# Patient Record
Sex: Female | Born: 1975 | Race: White | Hispanic: No | Marital: Single | Smoking: Current every day smoker
Health system: Southern US, Community
[De-identification: ages and names within clinical notes are randomized; demographics above are authoritative.]

## PROBLEM LIST (undated history)

## (undated) DIAGNOSIS — F489 Nonpsychotic mental disorder, unspecified: Secondary | ICD-10-CM

## (undated) DIAGNOSIS — K625 Hemorrhage of anus and rectum: Secondary | ICD-10-CM

## (undated) DIAGNOSIS — F3112 Bipolar disorder, current episode manic without psychotic features, moderate: Secondary | ICD-10-CM

## (undated) DIAGNOSIS — N809 Endometriosis, unspecified: Secondary | ICD-10-CM

## (undated) HISTORY — DX: Bipolar disorder, current episode manic without psychotic features, moderate: F31.12

## (undated) HISTORY — DX: Hemorrhage of anus and rectum: K62.5

## (undated) HISTORY — DX: Endometriosis, unspecified: N80.9

## (undated) HISTORY — PX: GALLBLADDER SURGERY: SHX652

## (undated) HISTORY — PX: CHOLECYSTECTOMY: SHX55

---

## 2007-10-01 ENCOUNTER — Other Ambulatory Visit: Payer: Self-pay

## 2007-10-01 ENCOUNTER — Emergency Department: Payer: Self-pay | Admitting: Emergency Medicine

## 2008-07-31 ENCOUNTER — Emergency Department: Payer: Self-pay | Admitting: Emergency Medicine

## 2008-08-19 ENCOUNTER — Inpatient Hospital Stay: Payer: Self-pay | Admitting: Psychiatry

## 2009-01-20 ENCOUNTER — Inpatient Hospital Stay: Payer: Self-pay | Admitting: Psychiatry

## 2009-04-08 ENCOUNTER — Emergency Department: Payer: Self-pay | Admitting: Emergency Medicine

## 2009-05-01 HISTORY — PX: OTHER SURGICAL HISTORY: SHX169

## 2009-06-24 ENCOUNTER — Emergency Department: Payer: Self-pay | Admitting: Emergency Medicine

## 2010-01-07 ENCOUNTER — Ambulatory Visit: Payer: Self-pay | Admitting: Obstetrics and Gynecology

## 2010-01-13 ENCOUNTER — Ambulatory Visit: Payer: Self-pay | Admitting: Obstetrics and Gynecology

## 2010-01-14 LAB — PATHOLOGY REPORT

## 2010-01-30 ENCOUNTER — Emergency Department: Payer: Self-pay | Admitting: Emergency Medicine

## 2012-12-11 ENCOUNTER — Ambulatory Visit: Payer: Self-pay | Admitting: Family

## 2013-06-24 ENCOUNTER — Emergency Department: Payer: Self-pay | Admitting: Emergency Medicine

## 2015-12-24 ENCOUNTER — Emergency Department: Payer: Medicare Other

## 2015-12-24 ENCOUNTER — Emergency Department
Admission: EM | Admit: 2015-12-24 | Discharge: 2015-12-24 | Disposition: A | Discharge status: Home / Self Care | Payer: Medicare Other | Attending: Emergency Medicine | Admitting: Emergency Medicine

## 2015-12-24 ENCOUNTER — Encounter: Payer: Self-pay | Admitting: Emergency Medicine

## 2015-12-24 DIAGNOSIS — F17210 Nicotine dependence, cigarettes, uncomplicated: Secondary | ICD-10-CM | POA: Diagnosis not present

## 2015-12-24 DIAGNOSIS — S93401A Sprain of unspecified ligament of right ankle, initial encounter: Secondary | ICD-10-CM | POA: Diagnosis not present

## 2015-12-24 DIAGNOSIS — Y999 Unspecified external cause status: Secondary | ICD-10-CM | POA: Insufficient documentation

## 2015-12-24 DIAGNOSIS — Y929 Unspecified place or not applicable: Secondary | ICD-10-CM | POA: Insufficient documentation

## 2015-12-24 DIAGNOSIS — W010XXA Fall on same level from slipping, tripping and stumbling without subsequent striking against object, initial encounter: Secondary | ICD-10-CM | POA: Insufficient documentation

## 2015-12-24 DIAGNOSIS — S99911A Unspecified injury of right ankle, initial encounter: Secondary | ICD-10-CM | POA: Diagnosis present

## 2015-12-24 DIAGNOSIS — Y9301 Activity, walking, marching and hiking: Secondary | ICD-10-CM | POA: Diagnosis not present

## 2015-12-24 MED ORDER — MELOXICAM 15 MG PO TABS: 15.0000 mg | ORAL_TABLET | Freq: Every day | ORAL | 0 refills | Status: DC

## 2015-12-24 NOTE — ED Triage Notes (Signed)
Patient presents to the ED with right ankle pain after tripping and falling.  Patient denies hitting her head and denies passing out.  Patient is in no obvious distress at this time.  Patient states, "I feel like my ankle is pulsating."

## 2015-12-24 NOTE — ED Provider Notes (Signed)
[facility] Emergency Department Provider Note  ____________________________________________  Time seen: Approximately 1:14 PM  I have reviewed the triage vital signs and the nursing notes.   HISTORY  Chief Complaint Ankle Pain    HPI Tabitha Williams is a 40 y.o. female who presents emergency department complaining of right ankle pain. Patient states that she was walking her dog when she tripped and injured her ankle. Patient is reporting pain to the anterolateral aspect of the right ankle. Patient reports that she was unable to bear weight after injury. No previous history of injuries to that ankle. Patient is not taking any medications prior to arrival.   History reviewed. No pertinent past medical history.  There are no active problems to display for this patient.   History reviewed. No pertinent surgical history.  Prior to Admission medications   Medication Sig Start Date End Date Taking? Authorizing Provider  meloxicam (MOBIC) 15 MG tablet Take 1 tablet (15 mg total) by mouth daily. 12/24/15   Delorise RoyalsJonathan D Dovber Ernest, PA-C    Allergies Morphine and related; Sulfa antibiotics; and Adhesive [tape]  No family history on file.  Social History Social History  Substance Use Topics  . Smoking status: Current Every Day Smoker    Packs/day: 1.00    Types: Cigarettes  . Smokeless tobacco: Never Used  . Alcohol use No     Review of Systems  Constitutional: No fever/chills Cardiovascular: no chest pain. Respiratory: no cough. No SOB. Musculoskeletal: Positive for right ankle pain Skin: Negative for rash, abrasions, lacerations, ecchymosis. Neurological: Negative for headaches, focal weakness or numbness. 10-point ROS otherwise negative.  ____________________________________________   PHYSICAL EXAM:  VITAL SIGNS: ED Triage Vitals  Enc Vitals Group     BP 12/24/15 1232 (!) 143/109     Pulse Rate 12/24/15 1236 90     Resp 12/24/15 1232  18     Temp 12/24/15 1232 97.9 F (36.6 C)     Temp Source 12/24/15 1232 Oral     SpO2 12/24/15 1232 96 %     Weight 12/24/15 1225 192 lb (87.1 kg)     Height 12/24/15 1225 5' 4.5" (1.638 m)     Head Circumference --      Peak Flow --      Pain Score 12/24/15 1225 7     Pain Loc --      Pain Edu? --      Excl. in GC? --      Constitutional: Alert and oriented. Well appearing and in no acute distress. Eyes: Conjunctivae are normal. PERRL. EOMI. Head: Atraumatic. Cardiovascular: Normal rate, regular rhythm. Normal S1 and S2.  Good peripheral circulation. Respiratory: Normal respiratory effort without tachypnea or retractions. Lungs CTAB. Good air entry to the bases with no decreased or absent breath sounds. Musculoskeletal: Full range of motion to all extremities. No gross deformities appreciated. Mild edema noted to the anterolateral aspect of the right ankle. Area is tender to palpation. No palpable abnormality. Dorsalis pedis pulse intact. Sensation intact. Upon coaxing, patient has full range of motion to ankle joint. Neurologic:  Normal speech and language. No gross focal neurologic deficits are appreciated.  Skin:  Skin is warm, dry and intact. No rash noted. Psychiatric: Mood and affect are normal. Speech and behavior are normal. Patient exhibits appropriate insight and judgement.   ____________________________________________   LABS (all labs ordered are listed, but only abnormal results are displayed)  Labs Reviewed - No data to display ____________________________________________  EKG  ____________________________________________  RADIOLOGY Festus Barren Jearl Soto, personally viewed and evaluated these images (plain radiographs) as part of my medical decision making, as well as reviewing the written report by the radiologist.  Dg Ankle Complete Right  Result Date: 12/24/2015 CLINICAL DATA:  Pt fell today and is having right ankle pain on the lateral side. No  previous injury. EXAM: RIGHT ANKLE - COMPLETE 3+ VIEW COMPARISON:  None. FINDINGS: There is no evidence of fracture, dislocation, or joint effusion. There is no evidence of arthropathy or other focal bone abnormality. Soft tissues are unremarkable. IMPRESSION: Negative. Electronically Signed   By: Amie Portland M.D.   On: 12/24/2015 12:51    ____________________________________________    PROCEDURES  Procedure(s) performed:    Procedures    Medications - No data to display   ____________________________________________   INITIAL IMPRESSION / ASSESSMENT AND PLAN / ED COURSE  Pertinent labs & imaging results that were available during my care of the patient were reviewed by me and considered in my medical decision making (see chart for details).  Review of the [place] CSRS was performed in accordance of the [facility] prior to dispensing any controlled drugs.  Clinical Course    Patient's diagnosis is consistent with Right ankle sprain. X-ray reveals no acute osseous abnormality.. Patient will be discharged home with prescriptions for anti-inflammatory for symptom control. Patient is given Ace bandage and crutches here in the emergency department.. Patient is to follow up with primary care provider as needed or otherwise directed. Patient is given ED precautions to return to the ED for any worsening or new symptoms.     ____________________________________________  FINAL CLINICAL IMPRESSION(S) / ED DIAGNOSES  Final diagnoses:  Right ankle sprain, initial encounter      NEW MEDICATIONS STARTED DURING THIS VISIT:  New Prescriptions   MELOXICAM (MOBIC) 15 MG TABLET    Take 1 tablet (15 mg total) by mouth daily.        This chart was dictated using voice recognition software/Dragon. Despite best efforts to proofread, errors can occur which can change the meaning. Any change was purely unintentional.    Racheal Patches, PA-C 12/24/15 1318    Myrna Blazer,  MD 12/24/15 [phone]

## 2016-05-02 ENCOUNTER — Encounter: Payer: Self-pay | Admitting: Emergency Medicine

## 2016-05-02 ENCOUNTER — Emergency Department
Admission: EM | Admit: 2016-05-02 | Discharge: 2016-05-02 | Disposition: A | Discharge status: Home / Self Care | Payer: Medicare Other | Attending: Emergency Medicine | Admitting: Emergency Medicine

## 2016-05-02 DIAGNOSIS — X58XXXA Exposure to other specified factors, initial encounter: Secondary | ICD-10-CM | POA: Diagnosis not present

## 2016-05-02 DIAGNOSIS — Y929 Unspecified place or not applicable: Secondary | ICD-10-CM | POA: Insufficient documentation

## 2016-05-02 DIAGNOSIS — S86812A Strain of other muscle(s) and tendon(s) at lower leg level, left leg, initial encounter: Secondary | ICD-10-CM | POA: Diagnosis not present

## 2016-05-02 DIAGNOSIS — Y999 Unspecified external cause status: Secondary | ICD-10-CM | POA: Diagnosis not present

## 2016-05-02 DIAGNOSIS — S86912A Strain of unspecified muscle(s) and tendon(s) at lower leg level, left leg, initial encounter: Secondary | ICD-10-CM

## 2016-05-02 DIAGNOSIS — F17210 Nicotine dependence, cigarettes, uncomplicated: Secondary | ICD-10-CM | POA: Diagnosis not present

## 2016-05-02 DIAGNOSIS — Y939 Activity, unspecified: Secondary | ICD-10-CM | POA: Diagnosis not present

## 2016-05-02 DIAGNOSIS — S8992XA Unspecified injury of left lower leg, initial encounter: Secondary | ICD-10-CM | POA: Diagnosis present

## 2016-05-02 HISTORY — DX: Nonpsychotic mental disorder, unspecified: F48.9

## 2016-05-02 MED ORDER — MELOXICAM 15 MG PO TABS: 15.0000 mg | ORAL_TABLET | Freq: Every day | ORAL | 0 refills | Status: DC

## 2016-05-02 MED ORDER — MELOXICAM 7.5 MG PO TABS
15.0000 mg | ORAL_TABLET | Freq: Once | ORAL | Status: AC
  Administered 2016-05-02: 15 mg via ORAL
  Filled 2016-05-02: qty 2

## 2016-05-02 NOTE — ED Notes (Signed)
Pt reports that she is having left leg pain since last Thursday - pt denies any fall or injury to leg - pt states she woke up with leg pain - denies redness or swelling

## 2016-05-02 NOTE — ED Triage Notes (Signed)
Patient presents to ED via POV c/o left leg pain. Patient ambulatory to triage. Patient states this started Thursday and it woke patient up from her sleep. Patient c/o medial left knee pain.Patient denies injury. No edema noted.

## 2016-05-02 NOTE — ED Provider Notes (Signed)
[facility] Emergency Department Provider Note  ____________________________________________  Time seen: Approximately 5:33 PM  I have reviewed the triage vital signs and the nursing notes.   HISTORY  Chief Complaint Leg Pain    HPI Tabitha Williams is a 41 y.o. female who presents to emergency department complaining of left medial knee pain. Per the patient, she woke up in 5 days prior with medial knee pain. Patient states that has been waxing and waning in intensity. Patient is tried intermittent use of Motrin, Aleve, Tylenol with some resolution but not complete resolution. She denies any specific trauma. She denies any edema to the leg. She denies any chest pain or shortness of breath. No history of previous knee injury. No history of DVT.   Past Medical History:  Diagnosis Date  . Mental health problem     There are no active problems to display for this patient.   Past Surgical History:  Procedure Laterality Date  . ABDOMINAL HYSTERECTOMY    . CHOLECYSTECTOMY      Prior to Admission medications   Medication Sig Start Date End Date Taking? Authorizing Provider  meloxicam (MOBIC) 15 MG tablet Take 1 tablet (15 mg total) by mouth daily. 05/02/16   Delorise Royals Daija Routson, PA-C    Allergies Morphine and related; Sulfa antibiotics; and Adhesive [tape]  No family history on file.  Social History Social History  Substance Use Topics  . Smoking status: Current Every Day Smoker    Packs/day: 1.00    Types: Cigarettes  . Smokeless tobacco: Never Used  . Alcohol use Yes     Comment: Social     Review of Systems  Constitutional: No fever/chills Cardiovascular: no chest pain. Respiratory: no cough. No SOB. Musculoskeletal: Positive for medial left knee pain Skin: Negative for rash, abrasions, lacerations, ecchymosis. Neurological: Negative for headaches, focal weakness or numbness. 10-point ROS otherwise  negative.  ____________________________________________   PHYSICAL EXAM:  VITAL SIGNS: ED Triage Vitals [05/02/16 1554]  Enc Vitals Group     BP (!) 156/89     Pulse Rate 85     Resp 19     Temp 98 F (36.7 C)     Temp Source Oral     SpO2 99 %     Weight 190 lb (86.2 kg)     Height 5\' 4"  (1.626 m)     Head Circumference      Peak Flow      Pain Score 5     Pain Loc      Pain Edu?      Excl. in GC?      Constitutional: Alert and oriented. Well appearing and in no acute distress. Eyes: Conjunctivae are normal. PERRL. EOMI. Head: Atraumatic. Neck: No stridor.    Cardiovascular: Normal rate, regular rhythm. Normal S1 and S2.  Good peripheral circulation. Respiratory: Normal respiratory effort without tachypnea or retractions. Lungs CTAB. Good air entry to the bases with no decreased or absent breath sounds. Musculoskeletal: Full range of motion to all extremities. No gross deformities appreciated.No edema or erythema noted to left lower extremity but inspection. Full range of motion of all joints left lower extremity. Examination of the knee reveals tenderness to palpation over the insertion points of the MCL. No palpable abnormality. Varus, valgus, Lachman's, McMurray's is negative. Dorsalis pedis pulse intact distally. Sensation intact distally. No edema, erythema, pain over deep vein distribution. Neurologic:  Normal speech and language. No gross focal neurologic deficits are appreciated.  Skin:  Skin is warm, dry and intact. No rash noted. Psychiatric: Mood and affect are normal. Speech and behavior are normal. Patient exhibits appropriate insight and judgement.   ____________________________________________   LABS (all labs ordered are listed, but only abnormal results are displayed)  Labs Reviewed - No data to display ____________________________________________  EKG   ____________________________________________  RADIOLOGY   No results  found.  ____________________________________________    PROCEDURES  Procedure(s) performed:    Procedures    Medications  meloxicam (MOBIC) tablet 15 mg (not administered)     ____________________________________________   INITIAL IMPRESSION / ASSESSMENT AND PLAN / ED COURSE  Pertinent labs & imaging results that were available during my care of the patient were reviewed by me and considered in my medical decision making (see chart for details).  Review of the [place] CSRS was performed in accordance of the [facility] prior to dispensing any controlled drugs.  Clinical Course     Patient's diagnosis is consistent with Left knee strain/MCL strain. Patient is tender to palpation over the insertion points of the MCL. No direct trauma. Exam is reassuring with no laxity. No concern at this time for DVT. No imaging or labs and the necessary at this time. Patient will be discharged home with prescriptions for anti-inflammatories for symptom control. Patient is to follow up with primary care as needed or otherwise directed. Patient is given ED precautions to return to the ED for any worsening or new symptoms.     ____________________________________________  FINAL CLINICAL IMPRESSION(S) / ED DIAGNOSES  Final diagnoses:  Knee strain, left, initial encounter      NEW MEDICATIONS STARTED DURING THIS VISIT:  New Prescriptions   MELOXICAM (MOBIC) 15 MG TABLET    Take 1 tablet (15 mg total) by mouth daily.        This chart was dictated using voice recognition software/Dragon. Despite best efforts to proofread, errors can occur which can change the meaning. Any change was purely unintentional.    Racheal PatchesJonathan D Jenee Spaugh, PA-C 05/02/16 1750    Arnaldo NatalPaul F Malinda, MD 05/02/16 [phone]

## 2016-07-10 ENCOUNTER — Ambulatory Visit: Payer: Medicare Other | Admitting: Obstetrics and Gynecology

## 2016-08-03 ENCOUNTER — Encounter: Payer: Self-pay | Admitting: Obstetrics and Gynecology

## 2016-08-04 ENCOUNTER — Ambulatory Visit (INDEPENDENT_AMBULATORY_CARE_PROVIDER_SITE_OTHER): Payer: Medicare Other | Admitting: Obstetrics and Gynecology

## 2016-08-04 ENCOUNTER — Encounter: Payer: Self-pay | Admitting: Obstetrics and Gynecology

## 2016-08-04 VITALS — BP 130/90 | HR 83 | Ht 64.0 in | Wt 198.0 lb

## 2016-08-04 DIAGNOSIS — Z01419 Encounter for gynecological examination (general) (routine) without abnormal findings: Secondary | ICD-10-CM

## 2016-08-04 DIAGNOSIS — Z113 Encounter for screening for infections with a predominantly sexual mode of transmission: Secondary | ICD-10-CM

## 2016-08-04 DIAGNOSIS — Z124 Encounter for screening for malignant neoplasm of cervix: Secondary | ICD-10-CM

## 2016-08-04 DIAGNOSIS — Z1239 Encounter for other screening for malignant neoplasm of breast: Secondary | ICD-10-CM

## 2016-08-04 NOTE — Progress Notes (Signed)
Obstetrics & Gynecology Office Visit    Chief Complaint  Patient presents with  . Committee Review    vaginal odor, pain rash under breast,  Annual Exam  History of Present Illness:  [name] is a 41 y.o. [id number] whos is amenorrheic due to history of hysterectomy, presents today for her annual examination.  Her menses are absent due to hysterectomy.  She does have vasomotor sx. She uses no meds.  She is single partner, contraception - hysterectomy. She does not have vaginal dryness.  Last Pap: about 5 years ago Hx of STDs: denies  Last mammogram: none There is no FH of breast cancer. There is no FH of ovarian cancer. The patient does not do self-breast exams.  Colonoscopy: never DEXA: has not been screened for osteoporosis  Tobacco use: The patient currently smokes 1 packs of cigarettes per day for multiple years. Alcohol use: none Exercise: not active  The patient wears seatbelts: yes.     She reports occasional "boils" under her left breast, which usually come to a head. She has no symptoms now. She also notes some breast discomfort when at home with no bra on.  Discomfort usually isn't there when she has her bra on.  She also notes periodic episodes of malodorous vaginal discharge with no other associated symptoms.  She states that she has none today.  Review of Systems: Review of Systems  Constitutional: Negative.   HENT: Negative.   Eyes: Negative.   Respiratory: Negative.   Cardiovascular: Negative.   Gastrointestinal: Negative.   Genitourinary: Negative.   Musculoskeletal: Negative.   Skin: Negative.   Neurological: Negative.   Psychiatric/Behavioral: Negative.    Past Medical History:  Diagnosis Date  . Bipolar 1 disorder with moderate mania (HCC)   . Endometriosis   . Mental health problem    Past Surgical History:  Procedure Laterality Date  . CESAREAN SECTION    . CHOLECYSTECTOMY    . laparoscopic supracervical hysterectomy  2011     Gynecologic History: No LMP recorded. Patient has had a hysterectomy.  Obstetric History: G2P1003  Family History  Problem Relation Age of Onset  . Skin cancer Mother   . Diabetes Father   . Hyperthyroidism Father     Social History   Social History  . Marital status: Single    Spouse name: N/A  . Number of children: N/A  . Years of education: N/A   Occupational History  . Not on file.   Social History Main Topics  . Smoking status: Current Every Day Smoker    Packs/day: 1.00    Types: Cigarettes  . Smokeless tobacco: Never Used  . Alcohol use Yes     Comment: Social  . Drug use: Yes    Types: Marijuana     Comment: once a month  . Sexual activity: Yes    Birth control/ protection: None   Other Topics Concern  . Not on file   Social History Narrative  . No narrative on file    Allergies  Allergen Reactions  . Morphine And Related Nausea And Vomiting  . Sulfa Antibiotics Hives  . Adhesive [Tape] Rash    Medications:   Medication Sig Start Date End Date Taking? Authorizing Provider  ALPRAZolam Prudy Feeler) 1 MG tablet Take 1 mg by mouth at bedtime as needed for anxiety.   Yes Historical Provider, MD  LITHIUM CARBONATE ER PO Take by mouth.    Historical Provider, MD    Physical  Exam BP 130/90   Pulse 83   Ht  (1.626 m)   Wt 198 lb (89.8 kg)   BMI 33.99 kg/m   No LMP recorded. Patient has had a hysterectomy. Physical Exam  Constitutional: She is oriented to person, place, and time. She appears well-developed and well-nourished.  Genitourinary: Vagina normal. Pelvic exam was performed with patient supine. There is no rash, tenderness, lesion or injury on the right labia. There is no rash, tenderness, lesion or injury on the left labia. Vagina exhibits rugosity. Vagina exhibits no lesion. No erythema, tenderness or bleeding in the vagina. No signs of injury around the vagina. No vaginal discharge found. Right adnexum does not display mass, does not  display tenderness and does not display fullness. Left adnexum does not display mass, does not display tenderness and does not display fullness.  Cervix exhibits pinkness. Cervix does not exhibit motion tenderness, lesion or discharge.  Genitourinary Comments: Uterus surgically absent  HENT:  Head: Normocephalic and atraumatic.  Eyes: EOM are normal. No scleral icterus.  Neck: Normal range of motion. Neck supple. No tracheal deviation present. No thyromegaly present.  Cardiovascular: Normal rate and regular rhythm.  Exam reveals no gallop and no friction rub.   No murmur heard. Pulmonary/Chest: Effort normal and breath sounds normal. She has no wheezes. She has no rales.  Abdominal: Soft. Bowel sounds are normal. She exhibits no distension and no mass. There is no tenderness. There is no guarding. Hernia confirmed negative in the right inguinal area and confirmed negative in the left inguinal area.  Musculoskeletal: Normal range of motion. She exhibits no edema or tenderness.  Lymphadenopathy:    She has no cervical adenopathy.       Right: No inguinal adenopathy present.       Left: No inguinal adenopathy present.  Neurological: She is alert and oriented to person, place, and time. No cranial nerve deficit.  Skin: Skin is warm and dry. No rash noted. No erythema.  Psychiatric: She has a normal mood and affect. Her behavior is normal. Judgment normal.   Female chaperone present for pelvic and breast  portions of the physical exam  Assessment: 41 y.o. G2P1003 here for annual gynecologic examination today.  Plan: -- Blood pressure screen: mildly elevated. Continue to monitor. -- Colonoscopy - not due -- Mammogram - due. Patient instructed to call [facility] to arrange. She states that she will.  -- Weight screening: Obese. Discussed lifestyle and behavioral changes to decrease weight.  She has undergone surgery last year and is beginning to be more active.  Will continue to monitor.    --  Nutrition: normal -- cholesterol screening: n/a -- osteoporosis screening: n/a -- tobacco screening: discussed quitting using the 5 A's. She is not interested in quitting at this time. -- family history of breast cancer screening: done. not at high risk. -- no evidence of domestic violence or intimate partner violence. -- STD screening: gonorrhea/chlamydia NAAT collected.  After patient left clinic I learned that I would not be able to process STD screen using pap smear sample collection as she is medicare and is only covered for a plain pap smear. -- pap smear collected.    Thomasene Mohair, MD 08/04/2016 5:24 PM

## 2016-08-08 LAB — PAP IG (IMAGE GUIDED): PAP Smear Comment: 0

## 2016-08-09 ENCOUNTER — Encounter: Payer: Self-pay | Admitting: Obstetrics and Gynecology

## 2016-08-30 ENCOUNTER — Telehealth: Payer: Self-pay

## 2016-08-30 NOTE — Telephone Encounter (Signed)
Pt calling to see if the there are any test results she needs to know about and has a question about a mammogram.  [phone]

## 2016-08-31 NOTE — Telephone Encounter (Signed)
Pt aware to call [facility] to schedule mammo

## 2016-12-15 DIAGNOSIS — F419 Anxiety disorder, unspecified: Secondary | ICD-10-CM | POA: Insufficient documentation

## 2016-12-15 DIAGNOSIS — Z833 Family history of diabetes mellitus: Secondary | ICD-10-CM | POA: Insufficient documentation

## 2016-12-15 DIAGNOSIS — I10 Essential (primary) hypertension: Secondary | ICD-10-CM | POA: Insufficient documentation

## 2017-02-01 ENCOUNTER — Other Ambulatory Visit: Payer: Self-pay | Admitting: Physician Assistant

## 2017-02-01 DIAGNOSIS — Z1231 Encounter for screening mammogram for malignant neoplasm of breast: Secondary | ICD-10-CM

## 2017-02-27 DIAGNOSIS — F319 Bipolar disorder, unspecified: Secondary | ICD-10-CM | POA: Insufficient documentation

## 2017-02-27 DIAGNOSIS — F4310 Post-traumatic stress disorder, unspecified: Secondary | ICD-10-CM | POA: Insufficient documentation

## 2017-03-13 ENCOUNTER — Ambulatory Visit
Admission: RE | Admit: 2017-03-13 | Discharge: 2017-03-13 | Disposition: A | Discharge status: Home / Self Care | Payer: Medicare Other | Source: Ambulatory Visit | Attending: Physician Assistant | Admitting: Physician Assistant

## 2017-03-13 DIAGNOSIS — Z1231 Encounter for screening mammogram for malignant neoplasm of breast: Secondary | ICD-10-CM | POA: Diagnosis present

## 2017-08-20 ENCOUNTER — Ambulatory Visit: Payer: Medicare Other | Admitting: Obstetrics and Gynecology

## 2017-08-26 IMAGING — DX DG ANKLE COMPLETE 3+V*R*
3 series · 3 of 3 positions shown · non-contrast
Comparison: None.

CLINICAL DATA: Pt fell today and is having right ankle pain on the
lateral side. No previous injury.

EXAM:
RIGHT ANKLE - COMPLETE 3+ VIEW

[ankle ap]
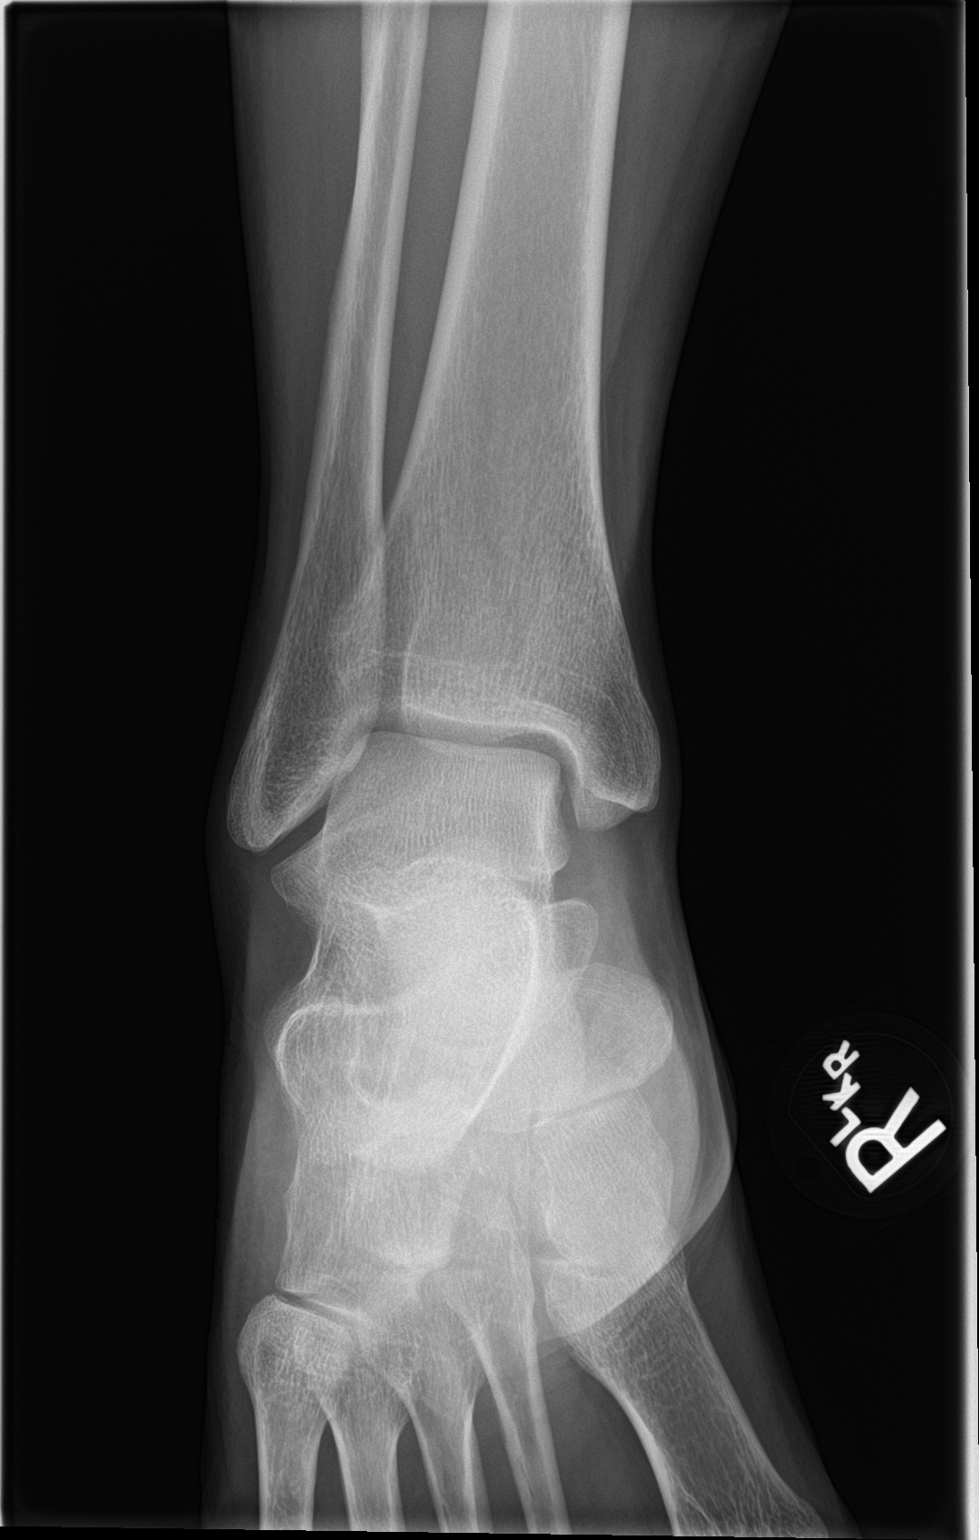

[ankle obl]
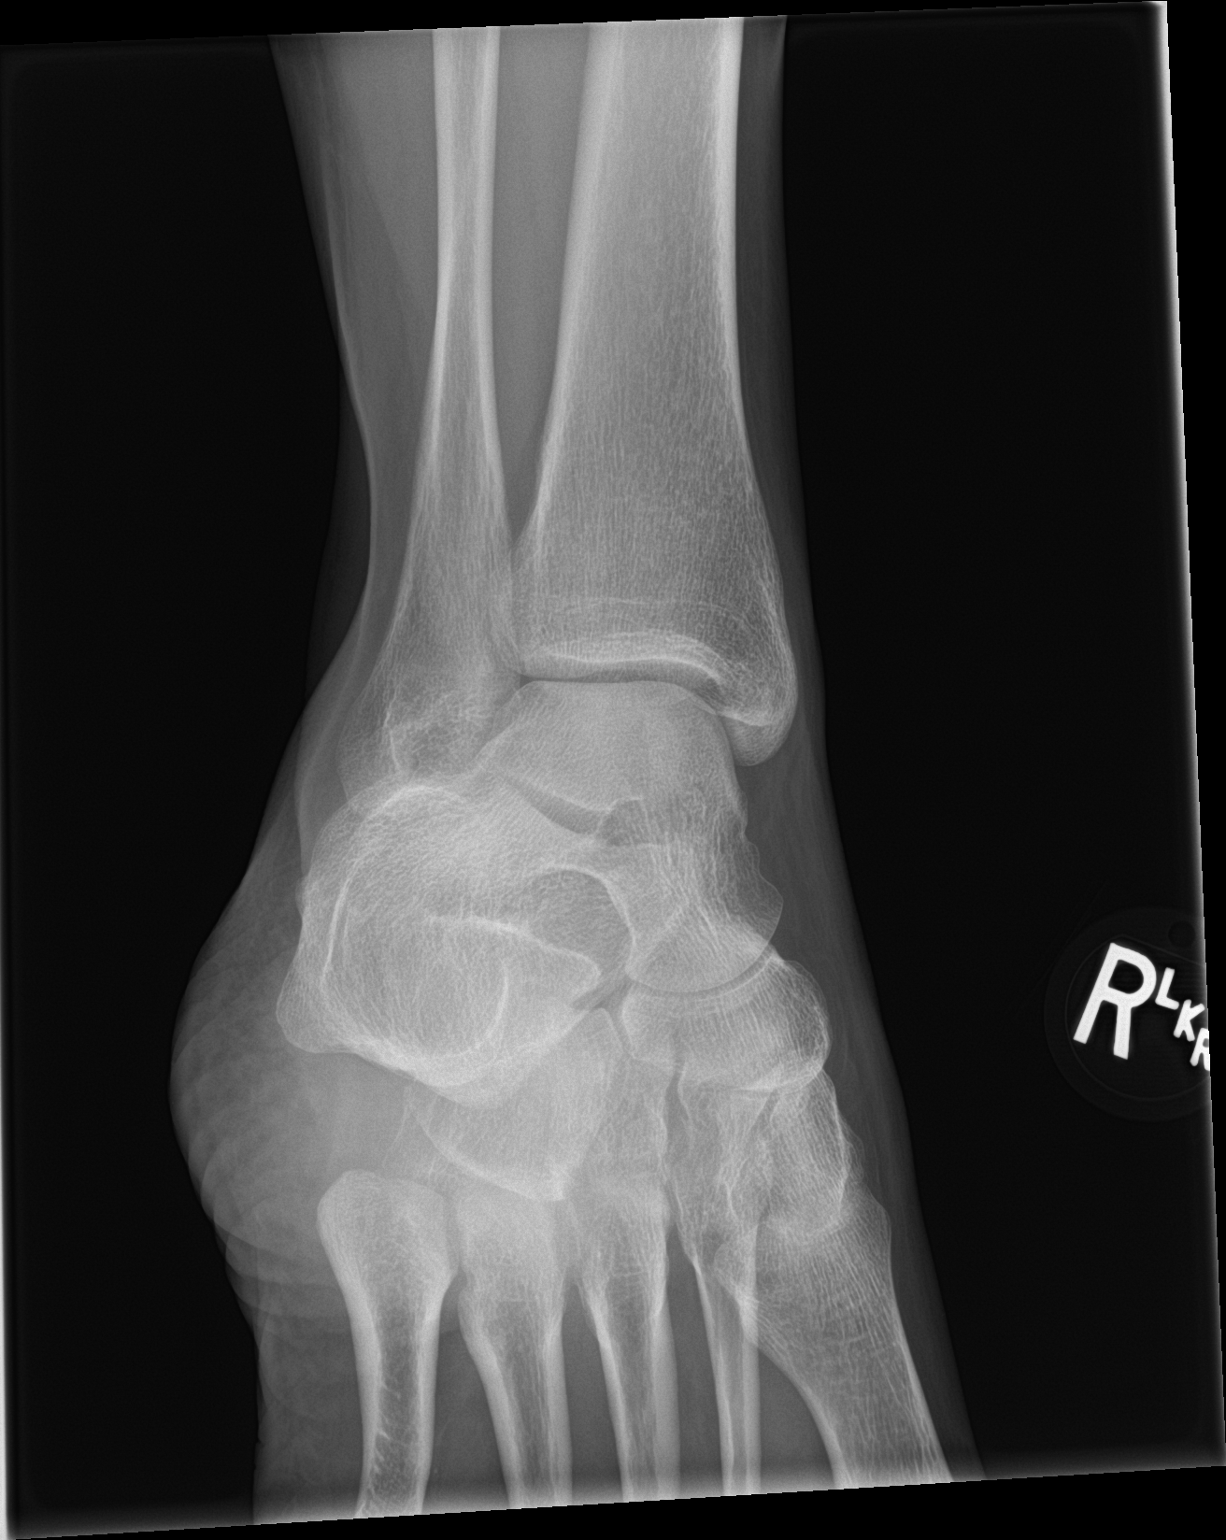

[ankle lat]
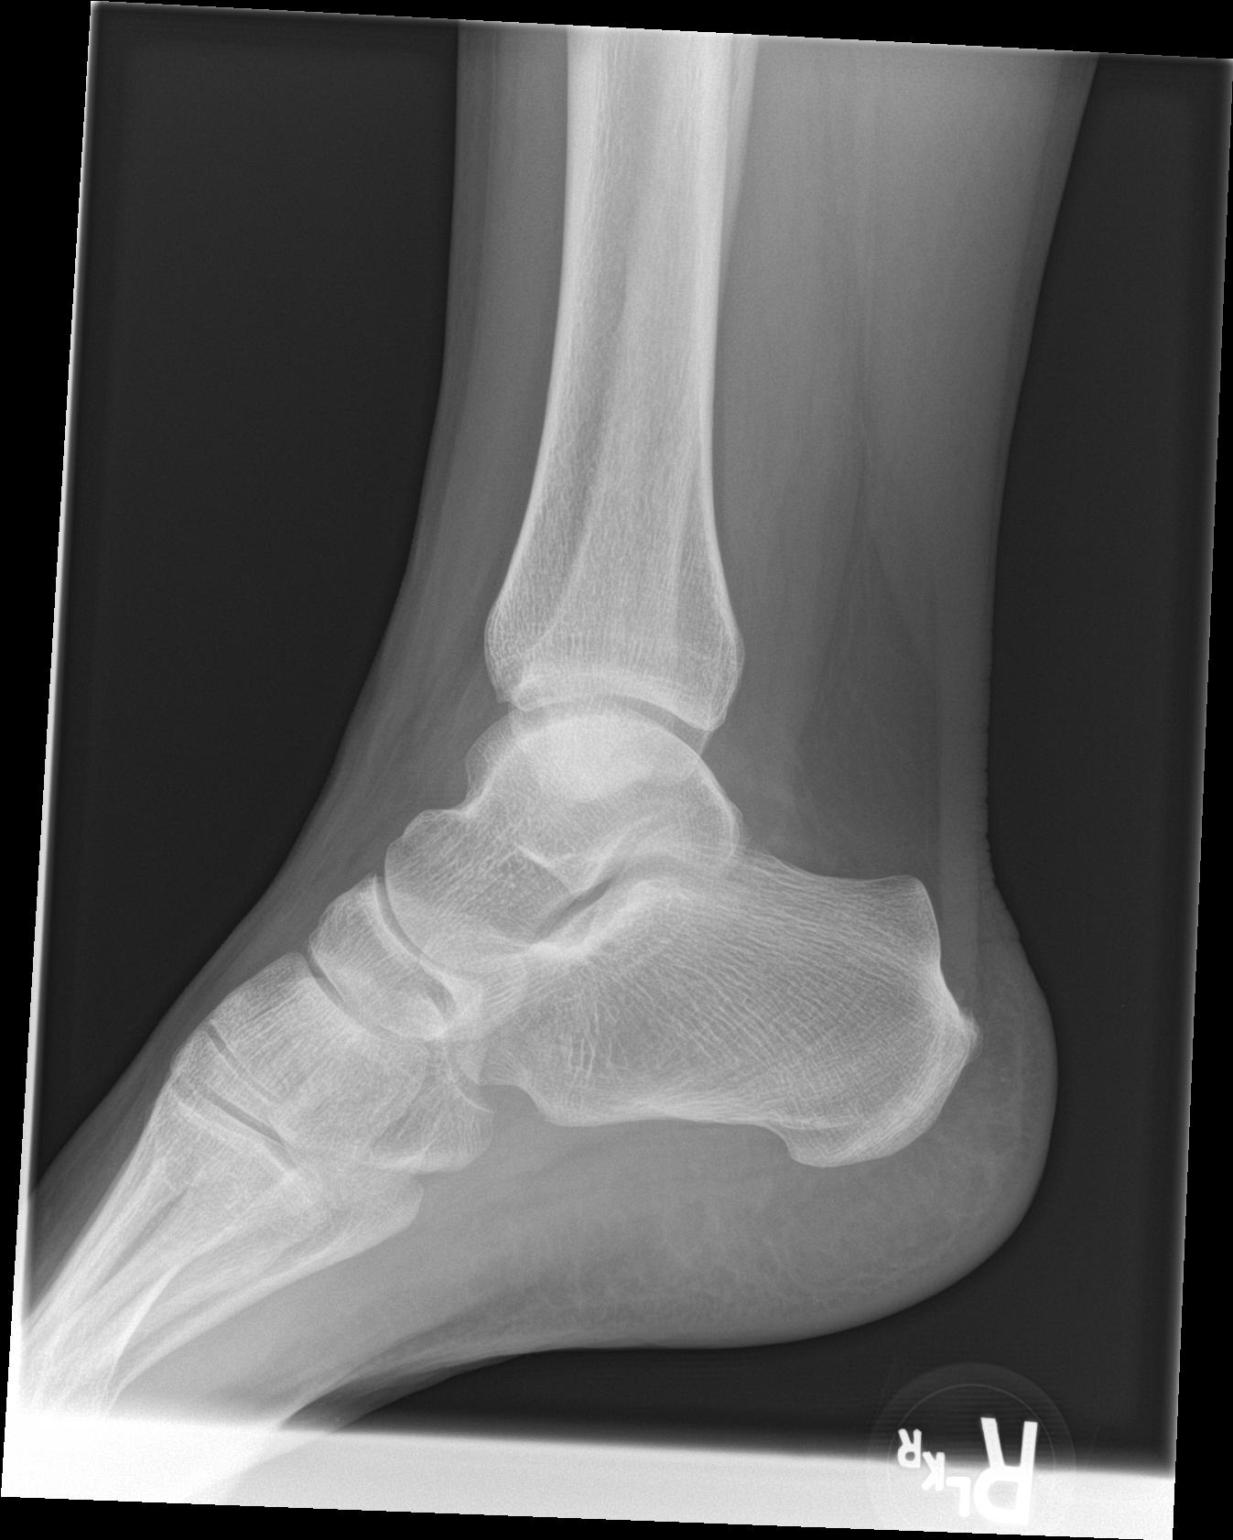

[3 of 3 positions shown; findings below may reference images not displayed]

FINDINGS: There is no evidence of fracture, dislocation, or joint effusion.
There is no evidence of arthropathy or other focal bone abnormality.
Soft tissues are unremarkable.
IMPRESSION: Negative.

## 2018-03-06 ENCOUNTER — Ambulatory Visit: Payer: Medicare Other | Admitting: Obstetrics and Gynecology

## 2018-09-26 DIAGNOSIS — J4540 Moderate persistent asthma, uncomplicated: Secondary | ICD-10-CM | POA: Insufficient documentation

## 2019-03-18 ENCOUNTER — Ambulatory Visit: Payer: Medicare Other | Admitting: Gastroenterology

## 2019-05-20 ENCOUNTER — Other Ambulatory Visit: Payer: Self-pay

## 2019-05-20 ENCOUNTER — Ambulatory Visit (INDEPENDENT_AMBULATORY_CARE_PROVIDER_SITE_OTHER): Payer: Medicare HMO | Admitting: Gastroenterology

## 2019-05-20 ENCOUNTER — Encounter: Payer: Self-pay | Admitting: Gastroenterology

## 2019-05-20 VITALS — BP 99/74 | HR 105 | Temp 97.8°F | Resp 18 | Ht 64.0 in | Wt 225.4 lb

## 2019-05-20 DIAGNOSIS — K625 Hemorrhage of anus and rectum: Secondary | ICD-10-CM | POA: Diagnosis not present

## 2019-05-20 DIAGNOSIS — K5909 Other constipation: Secondary | ICD-10-CM

## 2019-05-20 DIAGNOSIS — K644 Residual hemorrhoidal skin tags: Secondary | ICD-10-CM

## 2019-05-20 NOTE — Patient Instructions (Signed)

## 2019-05-20 NOTE — Progress Notes (Signed)
Cephas Darby, MD [address]  [place]  [place], [place] [address]  Main: [phone]  Fax: [phone]    Gastroenterology Consultation  Referring Provider:     Kerri Perches, PA-C Primary Care Physician:  Kerri Perches, PA-C Primary Gastroenterologist:  Dr. Cephas Darby Reason for Consultation:   Chronic constipation, rectal bleeding        HPI:   Tabitha Williams is a 44 y.o. female referred by Dr. Kerri Perches, PA-C  for consultation & management of chronic constipation.  Patient reports that she has been suffering from significant straining associated with symptoms of rectal bleeding, bright red blood, protrusion of soft tissue after bowel movement, itching, perianal irritation, rectal discomfort.  This has been ongoing for more than 5 years and they are intermittent.  She has tried over-the-counter hemorrhoidal remedies which have not helped.  She reports that her symptoms are slightly better compared to the past but she would like to get rid of the hemorrhoids.  She does spend about 15 to 25 minutes on the toilet bowel for bowel movement.  She drinks sodas every day, does consume white bread regularly, red meat intermittently.  She has history of bipolar, on lithium.  She has tried over-the-counter stool softeners without much help.  She lives alone.  She does smoke 1 pack/day Denies alcohol use She is on disability due to mental illness  NSAIDs: None  Antiplts/Anticoagulants/Anti thrombotics: None  GI Procedures: None She denies family history of GI malignancy in first-degree relatives Aunt has history of colon cancer  Past Medical History:  Diagnosis Date  . Bipolar 1 disorder with moderate mania ([place])   . Endometriosis   . Mental health problem     Past Surgical History:  Procedure Laterality Date  . CESAREAN SECTION    . CHOLECYSTECTOMY    . laparoscopic supracervical hysterectomy  2011     Current Outpatient Medications:  .  ALPRAZolam  (XANAX) 1 MG tablet, Take 1 mg by mouth at bedtime as needed for anxiety., Disp: , Rfl:  .  hydrochlorothiazide (MICROZIDE) 12.5 MG capsule, Take by mouth., Disp: , Rfl:  .  LITHIUM CARBONATE ER PO, Take by mouth., Disp: , Rfl:    Family History  Problem Relation Age of Onset  . Skin cancer Mother   . Diabetes Father   . Hyperthyroidism Father      Social History   Tobacco Use  . Smoking status: Current Every Day Smoker    Packs/day: 1.00    Types: Cigarettes  . Smokeless tobacco: Never Used  Substance Use Topics  . Alcohol use: Yes    Comment: Social  . Drug use: Yes    Types: Marijuana    Comment: once a month    Allergies as of 05/20/2019 - Review Complete 05/20/2019  Allergen Reaction Noted  . Morphine and related Nausea And Vomiting 12/24/2015  . Sulfa antibiotics Hives 12/24/2015  . Adhesive [tape] Rash 12/24/2015  . Sulfur Rash 01/16/2017    Review of Systems:    All systems reviewed and negative except where noted in HPI.   Physical Exam:  BP 99/74 (BP Location: Left Arm, Patient Position: Sitting, Cuff Size: Large)   Pulse (!) 105   Temp 97.8 F (36.6 C)   Resp 18   Ht 5\' 4"  (1.626 m)   Wt 225 lb 6.4 oz (102.2 kg)   BMI 38.69 kg/m  No LMP recorded. Patient has had a hysterectomy.  General:   Alert,  ingrown, well-developed, well-nourished, pleasant and cooperative in NAD Head:  Normocephalic and atraumatic. Eyes:  Sclera clear, no icterus.   Conjunctiva pink. Ears:  Normal auditory acuity. Nose:  No deformity, discharge, or lesions. Mouth:  No deformity or lesions,oropharynx pink & moist. Neck:  Supple; no masses or thyromegaly. Lungs:  Respirations even and unlabored.  Clear throughout to auscultation.   No wheezes, crackles, or rhonchi. No acute distress. Heart:  Regular rate and rhythm; no murmurs, clicks, rubs, or gallops. Abdomen:  Normal bowel sounds. Soft, non-tender and non-distended without masses, hepatosplenomegaly or hernias noted.  No  guarding or rebound tenderness.   Rectal: Not performed Msk:  Symmetrical without gross deformities. Good, equal movement & strength bilaterally. Pulses:  Normal pulses noted. Extremities:  No clubbing or edema.  No cyanosis. Neurologic:  Alert and oriented x3;  grossly normal neurologically. Skin:  Intact without significant lesions or rashes. No jaundice. Psych:  Alert and cooperative. Normal mood and affect.  Imaging Studies: No abdominal imaging  Assessment and Plan:   Tabitha Williams is a 44 y.o. female with chronic constipation and symptomatic external hemorrhoids  Chronic constipation Trial of Trulance, samples provided Reiterated to her about high-fiber diet, fiber supplements, information provided Fiberchoice samples divided again advised her to avoid carbonated beverages, minimize intake of white bread, switch to whole wheat/multigrain bread, adequate intake of water Check TSH  Rectal bleeding Given her age, recommend diagnostic colonoscopy to evaluate for colon cancer or polyps Check CBC  Grade 2 symptomatic external hemorrhoids Advised her about toilet hygiene Reiterated not to spend more than 5 minutes on the toilet Discussed with her about outpatient hemorrhoid ligation after the colonoscopy  Obesity Check CMP    Follow up in 2 months   Arlyss Repress, MD

## 2019-05-22 LAB — COMPREHENSIVE METABOLIC PANEL
ALT: 80 IU/L — ABNORMAL HIGH (ref 0–32)
AST: 41 IU/L — ABNORMAL HIGH (ref 0–40)
Albumin/Globulin Ratio: 1.6 (ref 1.2–2.2)
Albumin: 4.6 g/dL (ref 3.8–4.8)
Alkaline Phosphatase: 101 IU/L (ref 39–117)
BUN/Creatinine Ratio: 9 (ref 9–23)
BUN: 14 mg/dL (ref 6–24)
Bilirubin Total: 0.4 mg/dL (ref 0.0–1.2)
CO2: 19 mmol/L — ABNORMAL LOW (ref 20–29)
Calcium: 10.1 mg/dL (ref 8.7–10.2)
Chloride: 102 mmol/L (ref 96–106)
Creatinine, Ser: 1.48 mg/dL — ABNORMAL HIGH (ref 0.57–1.00)
GFR calc Af Amer: 50 mL/min/{1.73_m2} — ABNORMAL LOW (ref >59)
GFR calc non Af Amer: 43 mL/min/{1.73_m2} — ABNORMAL LOW (ref >59)
Globulin, Total: 2.8 g/dL (ref 1.5–4.5)
Glucose: 92 mg/dL (ref 65–99)
Potassium: 4.5 mmol/L (ref 3.5–5.2)
Sodium: 140 mmol/L (ref 134–144)
Total Protein: 7.4 g/dL (ref 6.0–8.5)

## 2019-05-22 LAB — CBC
Hematocrit: 40 % (ref 34.0–46.6)
Hemoglobin: 14.1 g/dL (ref 11.1–15.9)
MCH: 33.2 pg — ABNORMAL HIGH (ref 26.6–33.0)
MCHC: 35.3 g/dL (ref 31.5–35.7)
MCV: 94 fL (ref 79–97)
Platelets: 417 10*3/uL (ref 150–450)
RBC: 4.25 x10E6/uL (ref 3.77–5.28)
RDW: 12 % (ref 11.7–15.4)
WBC: 9.8 10*3/uL (ref 3.4–10.8)

## 2019-05-23 ENCOUNTER — Telehealth: Payer: Self-pay

## 2019-05-23 ENCOUNTER — Other Ambulatory Visit: Payer: Self-pay

## 2019-05-23 DIAGNOSIS — R748 Abnormal levels of other serum enzymes: Secondary | ICD-10-CM

## 2019-05-23 NOTE — Telephone Encounter (Signed)
Pt has been notified of results and new labs have been ordered and [place] RUQ has been scheduled for Wednesday 05/28/19. Pt has been notified and verbalized understanding

## 2019-05-23 NOTE — Telephone Encounter (Signed)
-----   Message from [name], MD sent at 05/22/2019  4:12 PM EST ----- Please inform patient that her liver enzymes are elevated most likely secondary to fatty liver.  Recommend right upper quadrant ultrasound and hepatitis panel  Rohini Vanga

## 2019-05-28 ENCOUNTER — Ambulatory Visit: Payer: Medicare HMO

## 2019-05-30 ENCOUNTER — Other Ambulatory Visit: Payer: Self-pay

## 2019-05-30 ENCOUNTER — Other Ambulatory Visit
Admission: RE | Admit: 2019-05-30 | Discharge: 2019-05-30 | Disposition: A | Discharge status: Home / Self Care | Payer: Medicare HMO | Source: Ambulatory Visit | Attending: Gastroenterology | Admitting: Gastroenterology

## 2019-05-30 DIAGNOSIS — Z01812 Encounter for preprocedural laboratory examination: Secondary | ICD-10-CM | POA: Insufficient documentation

## 2019-05-30 DIAGNOSIS — Z20822 Contact with and (suspected) exposure to covid-19: Secondary | ICD-10-CM | POA: Diagnosis not present

## 2019-05-30 LAB — SARS CORONAVIRUS 2 (TAT 6-24 HRS): SARS Coronavirus 2: NEGATIVE

## 2019-05-30 LAB — TSH: TSH: 0.883 u[IU]/mL (ref 0.450–4.500)

## 2019-06-02 LAB — CBC
Hematocrit: 40 % (ref 34.0–46.6)
Hemoglobin: 14.1 g/dL (ref 11.1–15.9)
MCH: 33.2 pg — ABNORMAL HIGH (ref 26.6–33.0)
MCHC: 35.3 g/dL (ref 31.5–35.7)
MCV: 94 fL (ref 79–97)
Platelets: 417 10*3/uL (ref 150–450)
RBC: 4.25 x10E6/uL (ref 3.77–5.28)
RDW: 12 % (ref 11.7–15.4)
WBC: 9.8 10*3/uL (ref 3.4–10.8)

## 2019-06-02 LAB — COMPREHENSIVE METABOLIC PANEL
ALT: 80 IU/L — ABNORMAL HIGH (ref 0–32)
AST: 41 IU/L — ABNORMAL HIGH (ref 0–40)
Albumin/Globulin Ratio: 1.6 (ref 1.2–2.2)
Albumin: 4.6 g/dL (ref 3.8–4.8)
Alkaline Phosphatase: 101 IU/L (ref 39–117)
BUN/Creatinine Ratio: 9 (ref 9–23)
BUN: 14 mg/dL (ref 6–24)
Bilirubin Total: 0.4 mg/dL (ref 0.0–1.2)
CO2: 19 mmol/L — ABNORMAL LOW (ref 20–29)
Calcium: 10.1 mg/dL (ref 8.7–10.2)
Chloride: 102 mmol/L (ref 96–106)
Creatinine, Ser: 1.48 mg/dL — ABNORMAL HIGH (ref 0.57–1.00)
GFR calc Af Amer: 50 mL/min/{1.73_m2} — ABNORMAL LOW (ref >59)
GFR calc non Af Amer: 43 mL/min/{1.73_m2} — ABNORMAL LOW (ref >59)
Globulin, Total: 2.8 g/dL (ref 1.5–4.5)
Glucose: 92 mg/dL (ref 65–99)
Potassium: 4.5 mmol/L (ref 3.5–5.2)
Sodium: 140 mmol/L (ref 134–144)
Total Protein: 7.4 g/dL (ref 6.0–8.5)

## 2019-06-02 LAB — VITAMIN D 1,25 DIHYDROXY
Vitamin D 1, 25 (OH)2 Total: 36 pg/mL
Vitamin D2 1, 25 (OH)2: <10 pg/mL
Vitamin D3 1, 25 (OH)2: 32 pg/mL

## 2019-06-02 LAB — TSH: TSH: 0.883 u[IU]/mL (ref 0.450–4.500)

## 2019-06-02 LAB — C-REACTIVE PROTEIN: CRP: 3 mg/L (ref 0–10)

## 2019-06-02 LAB — SPECIMEN STATUS REPORT

## 2019-06-03 ENCOUNTER — Ambulatory Visit: Payer: Medicare HMO | Admitting: Anesthesiology

## 2019-06-03 ENCOUNTER — Ambulatory Visit
Admission: RE | Admit: 2019-06-03 | Discharge: 2019-06-03 | Disposition: A | Discharge status: Home / Self Care | Payer: Medicare HMO | Attending: Gastroenterology | Admitting: Gastroenterology

## 2019-06-03 ENCOUNTER — Encounter: Payer: Self-pay | Admitting: Gastroenterology

## 2019-06-03 ENCOUNTER — Encounter
Admission: RE | Disposition: A | Discharge status: Home / Self Care | Payer: Self-pay | Source: Home / Self Care | Attending: Gastroenterology

## 2019-06-03 ENCOUNTER — Other Ambulatory Visit: Payer: Self-pay

## 2019-06-03 DIAGNOSIS — Z882 Allergy status to sulfonamides status: Secondary | ICD-10-CM | POA: Insufficient documentation

## 2019-06-03 DIAGNOSIS — Z8349 Family history of other endocrine, nutritional and metabolic diseases: Secondary | ICD-10-CM | POA: Insufficient documentation

## 2019-06-03 DIAGNOSIS — K644 Residual hemorrhoidal skin tags: Secondary | ICD-10-CM | POA: Insufficient documentation

## 2019-06-03 DIAGNOSIS — Z833 Family history of diabetes mellitus: Secondary | ICD-10-CM | POA: Diagnosis not present

## 2019-06-03 DIAGNOSIS — F319 Bipolar disorder, unspecified: Secondary | ICD-10-CM | POA: Diagnosis not present

## 2019-06-03 DIAGNOSIS — F17210 Nicotine dependence, cigarettes, uncomplicated: Secondary | ICD-10-CM | POA: Diagnosis not present

## 2019-06-03 DIAGNOSIS — Z9049 Acquired absence of other specified parts of digestive tract: Secondary | ICD-10-CM | POA: Insufficient documentation

## 2019-06-03 DIAGNOSIS — Z79899 Other long term (current) drug therapy: Secondary | ICD-10-CM | POA: Diagnosis not present

## 2019-06-03 DIAGNOSIS — Z885 Allergy status to narcotic agent status: Secondary | ICD-10-CM | POA: Insufficient documentation

## 2019-06-03 DIAGNOSIS — Z91048 Other nonmedicinal substance allergy status: Secondary | ICD-10-CM | POA: Insufficient documentation

## 2019-06-03 DIAGNOSIS — K6289 Other specified diseases of anus and rectum: Secondary | ICD-10-CM | POA: Insufficient documentation

## 2019-06-03 DIAGNOSIS — Z90710 Acquired absence of both cervix and uterus: Secondary | ICD-10-CM | POA: Insufficient documentation

## 2019-06-03 DIAGNOSIS — K625 Hemorrhage of anus and rectum: Secondary | ICD-10-CM | POA: Insufficient documentation

## 2019-06-03 DIAGNOSIS — Z808 Family history of malignant neoplasm of other organs or systems: Secondary | ICD-10-CM | POA: Insufficient documentation

## 2019-06-03 HISTORY — PX: COLONOSCOPY WITH PROPOFOL: SHX5780

## 2019-06-03 SURGERY — COLONOSCOPY WITH PROPOFOL
Anesthesia: General

## 2019-06-03 MED ORDER — PROPOFOL 500 MG/50ML IV EMUL
INTRAVENOUS | Status: DC | PRN
  Administered 2019-06-03: 175 ug/kg/min via INTRAVENOUS

## 2019-06-03 MED ORDER — PROPOFOL 500 MG/50ML IV EMUL
INTRAVENOUS | Status: AC
  Filled 2019-06-03: qty 50

## 2019-06-03 MED ORDER — PROPOFOL 10 MG/ML IV BOLUS
INTRAVENOUS | Status: DC | PRN
  Administered 2019-06-03: 70 mg via INTRAVENOUS
  Administered 2019-06-03: 20 mg via INTRAVENOUS

## 2019-06-03 MED ORDER — SODIUM CHLORIDE 0.9 % IV SOLN: INTRAVENOUS | Status: DC

## 2019-06-03 NOTE — Anesthesia Postprocedure Evaluation (Signed)
Anesthesia Post Note  Patient: Tabitha Williams  Procedure(s) Performed: COLONOSCOPY WITH PROPOFOL (N/A )  Patient location during evaluation: Endoscopy Anesthesia Type: General Level of consciousness: awake and alert and oriented Pain management: pain level controlled Vital Signs Assessment: post-procedure vital signs reviewed and stable Respiratory status: spontaneous breathing, nonlabored ventilation and respiratory function stable Cardiovascular status: blood pressure returned to baseline and stable Postop Assessment: no signs of nausea or vomiting Anesthetic complications: no     Last Vitals:  Vitals:   06/03/19 0945 06/03/19 0955  BP: 124/87 (!) 127/91  Pulse:    Resp:    Temp:      Last Pain:  Vitals:   06/03/19 0955  TempSrc:   PainSc: 0-No pain                 Leamon Palau

## 2019-06-03 NOTE — Anesthesia Preprocedure Evaluation (Signed)
Anesthesia Evaluation  Patient identified by MRN, date of birth, ID band Patient awake    Reviewed: Allergy & Precautions, H&P , NPO status , Patient's Chart, lab work & pertinent test results, reviewed documented beta blocker date and time   History of Anesthesia Complications Negative for: history of anesthetic complications  Airway Mallampati: II  TM Distance: >3 FB Neck ROM: full    Dental  (+) Dental Advidsory Given, Missing, Chipped, Teeth Intact   Pulmonary neg shortness of breath, COPD,  COPD inhaler, neg recent URI, Current Smoker,    Pulmonary exam normal        Cardiovascular Exercise Tolerance: Good hypertension, (-) angina(-) Past MI and (-) Cardiac Stents Normal cardiovascular exam(-) dysrhythmias (-) Valvular Problems/Murmurs     Neuro/Psych PSYCHIATRIC DISORDERS Bipolar Disorder negative neurological ROS     GI/Hepatic negative GI ROS, NAFLD   Endo/Other  negative endocrine ROS  Renal/GU negative Renal ROS  negative genitourinary   Musculoskeletal   Abdominal   Peds  Hematology negative hematology ROS (+)   Anesthesia Other Findings Past Medical History: No date: Bipolar 1 disorder with moderate mania ([facility]) No date: Endometriosis No date: Mental health problem   Reproductive/Obstetrics negative OB ROS                             Anesthesia Physical Anesthesia Plan  ASA: III  Anesthesia Plan: General   Post-op Pain Management:    Induction: Intravenous  PONV Risk Score and Plan: 2 and Propofol infusion and TIVA  Airway Management Planned: Natural Airway and Nasal Cannula  Additional Equipment:   Intra-op Plan:   Post-operative Plan:   Informed Consent: I have reviewed the patients History and Physical, chart, labs and discussed the procedure including the risks, benefits and alternatives for the proposed anesthesia with the patient or authorized  representative who has indicated his/her understanding and acceptance.     Dental Advisory Given  Plan Discussed with: Anesthesiologist, CRNA and Surgeon  Anesthesia Plan Comments:         Anesthesia Quick Evaluation

## 2019-06-03 NOTE — Anesthesia Procedure Notes (Signed)
Date/Time: 06/03/2019 9:09 AM Performed by: Junious Silk, CRNA Pre-anesthesia Checklist: Patient identified, Emergency Drugs available, Suction available, Patient being monitored and Timeout performed Oxygen Delivery Method: Nasal cannula

## 2019-06-03 NOTE — H&P (Signed)
Tabitha Repress, MD [address]  [address]  [place], [place] [address]  Main: [phone]  Fax: [phone] Pager: [phone]  Primary Care Physician:  Marya Fossa, PA-C Primary Gastroenterologist:  Dr. Arlyss Williams  Pre-Procedure History & Physical: HPI:  DAWNN Tabitha Williams is a 44 y.o. female is here for an colonoscopy.   Past Medical History:  Diagnosis Date  . Bipolar 1 disorder with moderate mania ([facility])   . Endometriosis   . Mental health problem     Past Surgical History:  Procedure Laterality Date  . CESAREAN SECTION    . CHOLECYSTECTOMY    . GALLBLADDER SURGERY    . laparoscopic supracervical hysterectomy  2011    Prior to Admission medications   Medication Sig Start Date End Date Taking? Authorizing Provider  ALPRAZolam Prudy Feeler) 1 MG tablet Take 1 mg by mouth at bedtime as needed for anxiety.   Yes [provider]  cloNIDine (CATAPRES) 0.1 MG tablet Take 0.1 mg by mouth daily.   Yes [provider]  Deutetrabenazine (AUSTEDO PO) Take 60 mg by mouth 2 (two) times daily.   Yes [provider]  lisinopril-hydrochlorothiazide (ZESTORETIC) 20-25 MG tablet Take 1 tablet by mouth daily.   Yes [provider]  lurasidone (LATUDA) 80 MG TABS tablet Take 80 mg by mouth daily with breakfast.   Yes [provider]  QUEtiapine Fumarate (SEROQUEL PO) Take 10 mg by mouth daily.   Yes [provider]  hydrochlorothiazide (MICROZIDE) 12.5 MG capsule Take by mouth.    [provider]  LITHIUM CARBONATE ER PO Take by mouth.    [provider]    Allergies as of 05/20/2019 - Review Complete 05/20/2019  Allergen Reaction Noted  . Morphine and related Nausea And Vomiting 12/24/2015  . Sulfa antibiotics Hives 12/24/2015  . Adhesive [tape] Rash 12/24/2015  . Sulfur Rash 01/16/2017    Family History  Problem Relation Age of Onset  . Skin cancer Mother   . Diabetes Father   . Hyperthyroidism  Father     Social History   Socioeconomic History  . Marital status: Single    Spouse name: Not on file  . Number of children: Not on file  . Years of education: Not on file  . Highest education level: Not on file  Occupational History  . Not on file  Tobacco Use  . Smoking status: Current Every Day Smoker    Packs/day: 1.00    Types: Cigarettes  . Smokeless tobacco: Never Used  Substance and Sexual Activity  . Alcohol use: Not Currently  . Drug use: Yes    Types: Marijuana    Comment: once a month  . Sexual activity: Yes    Birth control/protection: None  Other Topics Concern  . Not on file  Social History Narrative  . Not on file   Social Determinants of Health   Financial Resource Strain:   . Difficulty of Paying Living Expenses: Not on file  Food Insecurity:   . Worried About Programme researcher, broadcasting/film/video in the Last Year: Not on file  . Ran Out of Food in the Last Year: Not on file  Transportation Needs:   . Lack of Transportation (Medical): Not on file  . Lack of Transportation (Non-Medical): Not on file  Physical Activity:   . Days of Exercise per Week: Not on file  . Minutes of Exercise per Session: Not on file  Stress:   . Feeling of Stress : Not  on file  Social Connections:   . Frequency of Communication with Friends and Family: Not on file  . Frequency of Social Gatherings with Friends and Family: Not on file  . Attends Religious Services: Not on file  . Active Member of Clubs or Organizations: Not on file  . Attends Archivist Meetings: Not on file  . Marital Status: Not on file  Intimate Partner Violence:   . Fear of Current or Ex-Partner: Not on file  . Emotionally Abused: Not on file  . Physically Abused: Not on file  . Sexually Abused: Not on file    Review of Systems: See HPI, otherwise negative ROS  Physical Exam: BP (!) 136/108   Pulse (!) 108   Temp (!) 96.6 F (35.9 C) (Temporal)   Resp 18   Ht 5\' 4"  (1.626 m)   Wt 102.1 kg    BMI 38.62 kg/m  General:   Alert,  pleasant and cooperative in NAD Head:  Normocephalic and atraumatic. Neck:  Supple; no masses or thyromegaly. Lungs:  Clear throughout to auscultation.    Heart:  Regular rate and rhythm. Abdomen:  Soft, nontender and nondistended. Normal bowel sounds, without guarding, and without rebound.   Neurologic:  Alert and  oriented x4;  grossly normal neurologically.  Impression/Plan: Tabitha Williams is here for an colonoscopy to be performed for rectal bleeding  Risks, benefits, limitations, and alternatives regarding  colonoscopy have been reviewed with the patient.  Questions have been answered.  All parties agreeable.   Sherri Sear, MD  06/03/2019, 8:48 AM

## 2019-06-03 NOTE — Transfer of Care (Signed)
Immediate Anesthesia Transfer of Care Note  Patient: Tabitha Williams  Procedure(s) Performed: COLONOSCOPY WITH PROPOFOL (N/A )  Patient Location: PACU  Anesthesia Type:General  Level of Consciousness: sedated  Airway & Oxygen Therapy: Patient Spontanous Breathing and Patient connected to nasal cannula oxygen  Post-op Assessment: Report given to RN and Post -op Vital signs reviewed and stable  Post vital signs: Reviewed and stable  Last Vitals:  Vitals Value Taken Time  BP 107/93 06/03/19 0926  Temp 36.4 C 06/03/19 0925  Pulse 86 06/03/19 0934  Resp 16 06/03/19 0934  SpO2 99 % 06/03/19 0934  Vitals shown include unvalidated device data.  Last Pain:  Vitals:   06/03/19 0925  TempSrc: Temporal  PainSc:          Complications: No apparent anesthesia complications

## 2019-06-03 NOTE — Op Note (Signed)
[facility] Gastroenterology Patient Name: Tabitha Williams Procedure Date: 06/03/2019 7:57 AM MRN: [id number] Account #: [id number] Date of Birth: 16-Jul-1975 Admit Type: Outpatient Age: 44 Room: [facility] ENDO ROOM 1 Gender: Female Note Status: Finalized Procedure:             Colonoscopy Indications:           Rectal bleeding Providers:             Lin Landsman MD, MD Medicines:             Monitored Anesthesia Care Complications:         No immediate complications. Estimated blood loss: None. Procedure:             Pre-Anesthesia Assessment:                        - Prior to the procedure, a History and Physical was                         performed, and patient medications and allergies were                         reviewed. The patient is competent. The risks and                         benefits of the procedure and the sedation options and                         risks were discussed with the patient. All questions                         were answered and informed consent was obtained.                         Patient identification and proposed procedure were                         verified by the physician, the nurse, the                         anesthesiologist, the anesthetist and the technician                         in the pre-procedure area in the procedure room in the                         endoscopy suite. Mental Status Examination: alert and                         oriented. Airway Examination: normal oropharyngeal                         airway and neck mobility. Respiratory Examination:                         clear to auscultation. CV Examination: normal.                         Prophylactic Antibiotics: The patient does not require  prophylactic antibiotics. Prior Anticoagulants: The                         patient has taken no previous anticoagulant or                         antiplatelet agents. ASA Grade Assessment:  III - A                         patient with severe systemic disease. After reviewing                         the risks and benefits, the patient was deemed in                         satisfactory condition to undergo the procedure. The                         anesthesia plan was to use monitored anesthesia care                         (MAC). Immediately prior to administration of                         medications, the patient was re-assessed for adequacy                         to receive sedatives. The heart rate, respiratory                         rate, oxygen saturations, blood pressure, adequacy of                         pulmonary ventilation, and response to care were                         monitored throughout the procedure. The physical                         status of the patient was re-assessed after the                         procedure.                        After obtaining informed consent, the colonoscope was                         passed under direct vision. Throughout the procedure,                         the patient's blood pressure, pulse, and oxygen                         saturations were monitored continuously. The                         Colonoscope was introduced through the anus and  advanced to the the cecum, identified by appendiceal                         orifice and ileocecal valve. The colonoscopy was                         performed without difficulty. The patient tolerated                         the procedure well. The quality of the bowel                         preparation was fair. Findings:      Skin tags were found on perianal exam.      A patchy area of mildly erythematous mucosa was found in the mid rectum.       Biopsies were taken with a cold forceps for histology.      Normal mucosa was found in the entire colon.      The retroflexed view of the distal rectum and anal verge was normal and       showed no anal or  rectal abnormalities. Impression:            - Preparation of the colon was fair.                        - Perianal skin tags found on perianal exam.                        - Erythematous mucosa in the mid rectum. Biopsied.                        - Normal mucosa in the entire examined colon.                        - The distal rectum and anal verge are normal on                         retroflexion view. Recommendation:        - Discharge patient to home (with escort).                        - Resume previous diet today.                        - Continue present medications.                        - Await pathology results.                        - Return to my office as previously scheduled. Procedure Code(s):     --- Professional ---                        [id number], Colonoscopy, flexible; with biopsy, single or                         multiple Diagnosis Code(s):     --- Professional ---  K62.89, Other specified diseases of anus and rectum                        K64.4, Residual hemorrhoidal skin tags                        K62.5, Hemorrhage of anus and rectum CPT copyright 2019 American Medical Association. All rights reserved. The codes documented in this report are preliminary and upon coder review may  be revised to meet current compliance requirements. Dr. Ulyess Mort Lin Landsman MD, MD 06/03/2019 9:21:59 AM This report has been signed electronically. Number of Addenda: 0 Note Initiated On: 06/03/2019 7:57 AM Scope Withdrawal Time: 0 hours 10 minutes 38 seconds  Total Procedure Duration: 0 hours 17 minutes 29 seconds  Estimated Blood Loss:  Estimated blood loss: none.      [facility]

## 2019-06-04 ENCOUNTER — Encounter: Payer: Self-pay | Admitting: *Deleted

## 2019-06-04 LAB — SURGICAL PATHOLOGY

## 2019-06-05 ENCOUNTER — Other Ambulatory Visit: Payer: Self-pay

## 2019-06-05 ENCOUNTER — Telehealth: Payer: Self-pay

## 2019-06-05 ENCOUNTER — Ambulatory Visit
Admission: RE | Admit: 2019-06-05 | Discharge: 2019-06-05 | Disposition: A | Discharge status: Home / Self Care | Payer: Medicare HMO | Source: Ambulatory Visit | Attending: Gastroenterology | Admitting: Gastroenterology

## 2019-06-05 DIAGNOSIS — R748 Abnormal levels of other serum enzymes: Secondary | ICD-10-CM | POA: Insufficient documentation

## 2019-06-05 NOTE — Telephone Encounter (Signed)
-----   Message from [name], MD sent at 06/04/2019  3:12 PM EST ----- The rectal biopsies came back normal.  I will see her for hemorrhoid ligation as scheduled

## 2019-06-05 NOTE — Telephone Encounter (Signed)
Pt has been notified of results and verbalized understanding  

## 2019-06-09 ENCOUNTER — Telehealth: Payer: Self-pay | Admitting: Gastroenterology

## 2019-06-09 ENCOUNTER — Telehealth: Payer: Self-pay

## 2019-06-09 NOTE — Telephone Encounter (Signed)
Pt has been notified of results and verbalized understanding  

## 2019-06-09 NOTE — Telephone Encounter (Signed)
-----   Message from [name], MD sent at 06/09/2019  1:46 PM EST ----- Inform patient that the ultrasound liver shows fatty liver.  She should go to the lab to get viral hepatitis panel   Tabitha Williams

## 2019-06-09 NOTE — Telephone Encounter (Signed)
Patient is calling for the results to her u/s she had done on Thursday.

## 2019-07-17 ENCOUNTER — Encounter: Payer: Self-pay | Admitting: Gastroenterology

## 2019-07-22 ENCOUNTER — Ambulatory Visit: Payer: Medicare HMO | Admitting: Gastroenterology

## 2019-07-28 ENCOUNTER — Other Ambulatory Visit: Payer: Self-pay

## 2019-07-28 ENCOUNTER — Ambulatory Visit (INDEPENDENT_AMBULATORY_CARE_PROVIDER_SITE_OTHER): Payer: Medicare HMO | Admitting: Gastroenterology

## 2019-07-28 ENCOUNTER — Ambulatory Visit: Payer: Medicare HMO | Admitting: Gastroenterology

## 2019-07-28 ENCOUNTER — Encounter: Payer: Self-pay | Admitting: Gastroenterology

## 2019-07-28 VITALS — BP 117/80 | HR 91 | Temp 98.5°F | Ht 64.0 in | Wt 222.2 lb

## 2019-07-28 DIAGNOSIS — R748 Abnormal levels of other serum enzymes: Secondary | ICD-10-CM

## 2019-07-28 DIAGNOSIS — K625 Hemorrhage of anus and rectum: Secondary | ICD-10-CM

## 2019-07-28 DIAGNOSIS — K760 Fatty (change of) liver, not elsewhere classified: Secondary | ICD-10-CM

## 2019-07-28 NOTE — Progress Notes (Signed)
Cephas Darby, MD [address]  [place]  [place], [place] [address]  Main: [phone]  Fax: [phone]    Gastroenterology Consultation  Referring Provider:     Kerri Perches, PA-C Primary Care Physician:  Kerri Perches, PA-C Primary Gastroenterologist:  Dr. Cephas Darby Reason for Consultation:   Chronic constipation, rectal bleeding        HPI:   Tabitha Williams is a 44 y.o. female referred by Dr. Kerri Perches, PA-C  for consultation & management of chronic constipation.  Patient reports that she has been suffering from significant straining associated with symptoms of rectal bleeding, bright red blood, protrusion of soft tissue after bowel movement, itching, perianal irritation, rectal discomfort.  This has been ongoing for more than 5 years and they are intermittent.  She has tried over-the-counter hemorrhoidal remedies which have not helped.  She reports that her symptoms are slightly better compared to the past but she would like to get rid of the hemorrhoids.  She does spend about 15 to 25 minutes on the toilet bowel for bowel movement.  She drinks sodas every day, does consume white bread regularly, red meat intermittently.  She has history of bipolar, on lithium.  She has tried over-the-counter stool softeners without much help.  She lives alone.  She does smoke 1 pack/day Denies alcohol use She is on disability due to mental illness  Follow-up visit 07/28/2019 Patient underwent colonoscopy which was fairly unremarkable.  She reports that her constipation is better regulated.  She does have intermittent diarrhea.  She denies rectal bleeding.  She had elevated LFTs and underwent right upper ultrasound which revealed fatty liver.  Patient does not have any other concerns today   NSAIDs: None  Antiplts/Anticoagulants/Anti thrombotics: None  GI Procedures:  Colonoscopy 06/03/19 - Preparation of the colon was fair. - Perianal skin tags found on perianal  exam. - Erythematous mucosa in the mid rectum. Biopsied. - Normal mucosa in the entire examined colon. - The distal rectum and anal verge are normal on retroflexion view.  DIAGNOSIS:  A. RECTUM, RANDOM; COLD BIOPSY:  - BENIGN RECTAL MUCOSA WITH SUPERFICIAL REACTIVE CHANGES AND MILD  ERYTHEMA.  - NEGATIVE FOR FEATURES OF MICROSCOPIC PROCTITIS.  - NEGATIVE FOR DYSPLASIA AND MALIGNANCY. She denies family history of GI malignancy in first-degree relatives Aunt has history of colon cancer  Past Medical History:  Diagnosis Date  . Bipolar 1 disorder with moderate mania ([facility])   . Endometriosis   . Mental health problem     Past Surgical History:  Procedure Laterality Date  . CESAREAN SECTION    . CHOLECYSTECTOMY    . COLONOSCOPY WITH PROPOFOL N/A 06/03/2019   Procedure: COLONOSCOPY WITH PROPOFOL;  Surgeon: Lin Landsman, MD;  Location: [facility] ENDOSCOPY;  Service: Gastroenterology;  Laterality: N/A;  . GALLBLADDER SURGERY    . laparoscopic supracervical hysterectomy  2011     Current Outpatient Medications:  .  alprazolam (XANAX) 2 MG tablet, , Disp: , Rfl:  .  cloNIDine (CATAPRES) 0.2 MG tablet, Take 0.2 mg by mouth at bedtime., Disp: , Rfl:  .  Deutetrabenazine (AUSTEDO PO), Take 60 mg by mouth 2 (two) times daily., Disp: , Rfl:  .  lisinopril-hydrochlorothiazide (ZESTORETIC) 20-25 MG tablet, Take 1 tablet by mouth daily., Disp: , Rfl:  .  lurasidone (LATUDA) 80 MG TABS tablet, Take 80 mg by mouth daily with breakfast., Disp: , Rfl:  .  QUEtiapine Fumarate (SEROQUEL PO), Take 10 mg by mouth daily.,  Disp: , Rfl:  .  hydrochlorothiazide (MICROZIDE) 12.5 MG capsule, Take by mouth., Disp: , Rfl:  .  LITHIUM CARBONATE ER PO, Take by mouth., Disp: , Rfl:    Family History  Problem Relation Age of Onset  . Skin cancer Mother   . Diabetes Father   . Hyperthyroidism Father      Social History   Tobacco Use  . Smoking status: Current Every Day Smoker    Packs/day: 1.00     Types: Cigarettes  . Smokeless tobacco: Never Used  Substance Use Topics  . Alcohol use: Not Currently  . Drug use: Yes    Types: Marijuana    Comment: once a month    Allergies as of 07/28/2019 - Review Complete 07/28/2019  Allergen Reaction Noted  . Morphine and related Nausea And Vomiting 12/24/2015  . Sulfa antibiotics Hives 12/24/2015  . Adhesive [tape] Rash 12/24/2015  . Sulfur Rash 01/16/2017    Review of Systems:    All systems reviewed and negative except where noted in HPI.   Physical Exam:  BP 117/80   Pulse 91   Temp 98.5 F (36.9 C) (Oral)   Ht 5\' 4"  (1.626 m)   Wt 222 lb 3.2 oz (100.8 kg)   BMI 38.14 kg/m  No LMP recorded. Patient has had a hysterectomy.  General:   Alert, ingrown, well-developed, well-nourished, pleasant and cooperative in NAD Head:  Normocephalic and atraumatic. Eyes:  Sclera clear, no icterus.   Conjunctiva pink. Ears:  Normal auditory acuity. Nose:  No deformity, discharge, or lesions. Mouth:  No deformity or lesions,oropharynx pink & moist. Neck:  Supple; no masses or thyromegaly. Lungs:  Respirations even and unlabored.  Clear throughout to auscultation.   No wheezes, crackles, or rhonchi. No acute distress. Heart:  Regular rate and rhythm; no murmurs, clicks, rubs, or gallops. Abdomen:  Normal bowel sounds. Soft, obese, non-tender and non-distended without masses, hepatosplenomegaly or hernias noted.  No guarding or rebound tenderness.   Rectal: Not performed Msk:  Symmetrical without gross deformities. Good, equal movement & strength bilaterally. Pulses:  Normal pulses noted. Extremities:  No clubbing or edema.  No cyanosis. Neurologic:  Alert and oriented x3;  grossly normal neurologically. Skin:  Intact without significant lesions or rashes. No jaundice. Psych:  Alert and cooperative. Normal mood and affect.  Imaging Studies: No abdominal imaging  Assessment and Plan:   Tabitha Williams is a 44 y.o. female with chronic  constipation and symptomatic external hemorrhoids, fatty liver  Chronic constipation, currently resolved TSH normal Continue high-fiber diet, fiber supplements as needed   Rectal bleeding No evidence of anemia Colonoscopy revealed erythematous mucosa in the rectum, biopsies did not reveal any evidence of ulcerative proctitis.  Otherwise, unremarkable  Grade 2 symptomatic external hemorrhoids, currently asymptomatic Advised her about toilet hygiene Reiterated not to spend more than 5 minutes on the toilet Defer hemorrhoid ligation at this time  Elevated LFTs secondary to fatty liver Check viral hepatitis panel Reiterated on healthy living, weight control Recheck LFTs in 6 months, if persistently elevated, will pursue secondary liver disease work-up  Follow up in 6 months   55, MD

## 2019-07-29 LAB — HEPATITIS PANEL, ACUTE
Hep A IgM: NEGATIVE
Hep B C IgM: NEGATIVE
Hep C Virus Ab: <0.1 s/co ratio (ref 0.0–0.9)
Hepatitis B Surface Ag: NEGATIVE

## 2020-01-23 ENCOUNTER — Other Ambulatory Visit: Payer: Self-pay

## 2020-01-23 ENCOUNTER — Ambulatory Visit (INDEPENDENT_AMBULATORY_CARE_PROVIDER_SITE_OTHER): Payer: Medicare HMO | Admitting: Gastroenterology

## 2020-01-23 ENCOUNTER — Encounter: Payer: Self-pay | Admitting: Gastroenterology

## 2020-01-23 VITALS — BP 97/67 | HR 81 | Temp 97.5°F | Wt 246.1 lb

## 2020-01-23 DIAGNOSIS — R748 Abnormal levels of other serum enzymes: Secondary | ICD-10-CM | POA: Diagnosis not present

## 2020-01-23 NOTE — Progress Notes (Signed)
Arlyss Repress, MD [address]  [address]  [place], [place] [address]  Main: [phone]  Fax: [phone]    Gastroenterology Consultation  Referring Provider:     Marya Fossa, PA-C Primary Care Physician:  Marya Fossa, PA-C Primary Gastroenterologist:  Dr. Arlyss Repress Reason for Consultation:   Chronic constipation, rectal bleeding        HPI:   Tabitha Williams is a 44 y.o. female referred by Dr. Marya Fossa, PA-C  for consultation & management of chronic constipation.  Patient reports that she has been suffering from significant straining associated with symptoms of rectal bleeding, bright red blood, protrusion of soft tissue after bowel movement, itching, perianal irritation, rectal discomfort.  This has been ongoing for more than 5 years and they are intermittent.  She has tried over-the-counter hemorrhoidal remedies which have not helped.  She reports that her symptoms are slightly better compared to the past but she would like to get rid of the hemorrhoids.  She does spend about 15 to 25 minutes on the toilet bowel for bowel movement.  She drinks sodas every day, does consume white bread regularly, red meat intermittently.  She has history of bipolar, on lithium.  She has tried over-the-counter stool softeners without much help.  She lives alone.  She does smoke 1 pack/day Denies alcohol use She is on disability due to mental illness  Follow-up visit 07/28/2019 Patient underwent colonoscopy which was fairly unremarkable.  She reports that her constipation is better regulated.  She does have intermittent diarrhea.  She denies rectal bleeding.  She had elevated LFTs and underwent right upper ultrasound which revealed fatty liver.  Patient does not have any other concerns today  Follow-up visit 01/23/2020 Patient reports that she has been going through tremendous stress in her personal life.  She has gained more than 15 pounds since last visit.  She reports  drinking sodas daily and also snacking.  She denies constipation or rectal bleeding.  She denies any GI symptoms today   NSAIDs: None  Antiplts/Anticoagulants/Anti thrombotics: None  GI Procedures:  Colonoscopy 06/03/19 - Preparation of the colon was fair. - Perianal skin tags found on perianal exam. - Erythematous mucosa in the mid rectum. Biopsied. - Normal mucosa in the entire examined colon. - The distal rectum and anal verge are normal on retroflexion view.  DIAGNOSIS:  A. RECTUM, RANDOM; COLD BIOPSY:  - BENIGN RECTAL MUCOSA WITH SUPERFICIAL REACTIVE CHANGES AND MILD  ERYTHEMA.  - NEGATIVE FOR FEATURES OF MICROSCOPIC PROCTITIS.  - NEGATIVE FOR DYSPLASIA AND MALIGNANCY. She denies family history of GI malignancy in first-degree relatives Aunt has history of colon cancer  Past Medical History:  Diagnosis Date  . Bipolar 1 disorder with moderate mania ([facility])   . Endometriosis   . Mental health problem   . Rectal bleeding     Past Surgical History:  Procedure Laterality Date  . CESAREAN SECTION    . CHOLECYSTECTOMY    . COLONOSCOPY WITH PROPOFOL N/A 06/03/2019   Procedure: COLONOSCOPY WITH PROPOFOL;  Surgeon: [name], MD;  Location: [facility] ENDOSCOPY;  Service: Gastroenterology;  Laterality: N/A;  . GALLBLADDER SURGERY    . laparoscopic supracervical hysterectomy  2011     Current Outpatient Medications:  .  alprazolam (XANAX) 2 MG tablet, , Disp: , Rfl:  .  cloNIDine (CATAPRES) 0.2 MG tablet, Take 0.2 mg by mouth at bedtime., Disp: , Rfl:  .  Deutetrabenazine (AUSTEDO PO), Take 60 mg by mouth  2 (two) times daily., Disp: , Rfl:  .  FLOVENT HFA 110 MCG/ACT inhaler, Inhale 2 puffs into the lungs 2 (two) times daily., Disp: , Rfl:  .  lisinopril-hydrochlorothiazide (ZESTORETIC) 20-25 MG tablet, Take 1 tablet by mouth daily., Disp: , Rfl:  .  lurasidone (LATUDA) 80 MG TABS tablet, Take 80 mg by mouth daily with breakfast., Disp: , Rfl:  .  QUEtiapine Fumarate  (SEROQUEL PO), Take 10 mg by mouth daily., Disp: , Rfl:  .  chlorhexidine (PERIDEX) 0.12 % solution, 15 mLs 2 (two) times daily. (Patient not taking: Reported on 01/23/2020), Disp: , Rfl:  .  hydrochlorothiazide (MICROZIDE) 12.5 MG capsule, Take by mouth. (Patient not taking: Reported on 01/23/2020), Disp: , Rfl:  .  HYDROcodone-acetaminophen (NORCO/VICODIN) 5-325 MG tablet, , Disp: , Rfl:  .  lidocaine (XYLOCAINE) 2 % solution, , Disp: , Rfl:  .  LITHIUM CARBONATE ER PO, Take by mouth. (Patient not taking: Reported on 01/23/2020), Disp: , Rfl:    Family History  Problem Relation Age of Onset  . Skin cancer Mother   . Diabetes Father   . Hyperthyroidism Father      Social History   Tobacco Use  . Smoking status: Current Every Day Smoker    Packs/day: 1.00    Types: Cigarettes  . Smokeless tobacco: Never Used  Vaping Use  . Vaping Use: Never used  Substance Use Topics  . Alcohol use: Not Currently  . Drug use: Yes    Types: Marijuana    Comment: once a month    Allergies as of 01/23/2020 - Review Complete 01/23/2020  Allergen Reaction Noted  . Morphine and related Nausea And Vomiting 12/24/2015  . Sulfa antibiotics Hives 12/24/2015  . Adhesive [tape] Rash 12/24/2015  . Sulfur Rash 01/16/2017    Review of Systems:    All systems reviewed and negative except where noted in HPI.   Physical Exam:  BP 97/67 (BP Location: Left Arm, Patient Position: Sitting, Cuff Size: Normal)   Pulse 81   Temp (!) 97.5 F (36.4 C) (Oral)   Wt 246 lb 2 oz (111.6 kg)   BMI 42.25 kg/m  No LMP recorded. Patient has had a hysterectomy.  General:   Alert, ingrown, well-developed, well-nourished, pleasant and cooperative in NAD Head:  Normocephalic and atraumatic. Eyes:  Sclera clear, no icterus.   Conjunctiva pink. Ears:  Normal auditory acuity. Nose:  No deformity, discharge, or lesions. Mouth:  No deformity or lesions,oropharynx pink & moist. Neck:  Supple; no masses or  thyromegaly. Lungs:  Respirations even and unlabored.  Clear throughout to auscultation.   No wheezes, crackles, or rhonchi. No acute distress. Heart:  Regular rate and rhythm; no murmurs, clicks, rubs, or gallops. Abdomen:  Normal bowel sounds. Soft, obese, non-tender and non-distended without masses, hepatosplenomegaly or hernias noted.  No guarding or rebound tenderness.   Rectal: Not performed Msk:  Symmetrical without gross deformities. Good, equal movement & strength bilaterally. Pulses:  Normal pulses noted. Extremities:  No clubbing or edema.  No cyanosis. Neurologic:  Alert and oriented x3;  grossly normal neurologically. Skin:  Intact without significant lesions or rashes. No jaundice. Psych:  Alert and cooperative. Normal mood and affect.  Imaging Studies: No abdominal imaging  Assessment and Plan:   Tabitha Williams is a 44 y.o. female with chronic constipation and symptomatic external hemorrhoids, fatty liver  Chronic constipation, currently resolved TSH normal Continue high-fiber diet, fiber supplements as needed  Rectal bleeding No evidence of anemia  Colonoscopy revealed erythematous mucosa in the rectum, biopsies did not reveal any evidence of ulcerative proctitis.  Otherwise, unremarkable  Grade 2 symptomatic external hemorrhoids, currently asymptomatic Advised her about toilet hygiene Reiterated not to spend more than 5 minutes on the toilet Defer hemorrhoid ligation at this time  Elevated LFTs secondary to fatty liver Acute viral hepatitis panel negative Reiterated on healthy living, weight control Recheck LFTs today, if persistently elevated, will pursue secondary liver disease work-up  Follow up in 6 months   Arlyss Repress, MD

## 2020-01-24 LAB — HEPATIC FUNCTION PANEL
ALT: 12 IU/L (ref 0–32)
AST: 12 IU/L (ref 0–40)
Albumin: 4.6 g/dL (ref 3.8–4.8)
Alkaline Phosphatase: 83 IU/L (ref 44–121)
Bilirubin Total: 0.6 mg/dL (ref 0.0–1.2)
Bilirubin, Direct: 0.13 mg/dL (ref 0.00–0.40)
Total Protein: 7.3 g/dL (ref 6.0–8.5)

## 2020-02-06 DIAGNOSIS — G44229 Chronic tension-type headache, not intractable: Secondary | ICD-10-CM | POA: Insufficient documentation

## 2020-03-09 ENCOUNTER — Other Ambulatory Visit: Payer: Self-pay | Admitting: Primary Care

## 2020-03-09 ENCOUNTER — Other Ambulatory Visit (HOSPITAL_COMMUNITY): Payer: Self-pay | Admitting: Primary Care

## 2020-03-09 DIAGNOSIS — R519 Headache, unspecified: Secondary | ICD-10-CM

## 2020-03-22 ENCOUNTER — Other Ambulatory Visit: Payer: Self-pay

## 2020-03-22 ENCOUNTER — Ambulatory Visit
Admission: RE | Admit: 2020-03-22 | Discharge: 2020-03-22 | Disposition: A | Discharge status: Home / Self Care | Payer: Medicare HMO | Source: Ambulatory Visit | Attending: Primary Care | Admitting: Primary Care

## 2020-03-22 DIAGNOSIS — R519 Headache, unspecified: Secondary | ICD-10-CM | POA: Diagnosis not present

## 2020-06-21 DIAGNOSIS — R110 Nausea: Secondary | ICD-10-CM | POA: Insufficient documentation

## 2020-06-21 DIAGNOSIS — G8929 Other chronic pain: Secondary | ICD-10-CM | POA: Insufficient documentation

## 2020-06-21 DIAGNOSIS — R202 Paresthesia of skin: Secondary | ICD-10-CM | POA: Insufficient documentation

## 2020-06-21 DIAGNOSIS — R519 Headache, unspecified: Secondary | ICD-10-CM | POA: Insufficient documentation

## 2020-08-09 ENCOUNTER — Ambulatory Visit (INDEPENDENT_AMBULATORY_CARE_PROVIDER_SITE_OTHER): Payer: Medicare HMO | Admitting: Gastroenterology

## 2020-08-09 ENCOUNTER — Other Ambulatory Visit: Payer: Self-pay

## 2020-08-09 ENCOUNTER — Encounter: Payer: Self-pay | Admitting: Gastroenterology

## 2020-08-09 VITALS — BP 105/70 | HR 108 | Temp 98.1°F | Ht 64.0 in | Wt 242.2 lb

## 2020-08-09 DIAGNOSIS — K5909 Other constipation: Secondary | ICD-10-CM | POA: Diagnosis not present

## 2020-08-09 DIAGNOSIS — K644 Residual hemorrhoidal skin tags: Secondary | ICD-10-CM | POA: Diagnosis not present

## 2020-08-09 DIAGNOSIS — E538 Deficiency of other specified B group vitamins: Secondary | ICD-10-CM

## 2020-08-09 DIAGNOSIS — E559 Vitamin D deficiency, unspecified: Secondary | ICD-10-CM

## 2020-08-09 MED ORDER — VITAMIN D (ERGOCALCIFEROL) 1.25 MG (50000 UNIT) PO CAPS: 50000.0000 [IU] | ORAL_CAPSULE | ORAL | 0 refills | Status: DC

## 2020-08-09 MED ORDER — HYDROCORTISONE (PERIANAL) 2.5 % EX CREA: 1.0000 "application " | TOPICAL_CREAM | Freq: Two times a day (BID) | CUTANEOUS | 0 refills | Status: AC

## 2020-08-09 NOTE — Progress Notes (Signed)
Tabitha Repress, MD [address]  [address]  [place], [place] [address]  Main: [phone]  Fax: [phone]    Gastroenterology Consultation  Referring Provider:     Marya Fossa, PA-C Primary Care Physician:  Marya Fossa, PA-C Primary Gastroenterologist:  Dr. Arlyss Williams Reason for Consultation:   Chronic constipation, rectal bleeding        HPI:   Tabitha Williams is a 45 y.o. female referred by Dr. Marya Fossa, PA-C  for consultation & management of chronic constipation.  Patient reports that she has been suffering from significant straining associated with symptoms of rectal bleeding, bright red blood, protrusion of soft tissue after bowel movement, itching, perianal irritation, rectal discomfort.  This has been ongoing for more than 5 years and they are intermittent.  She has tried over-the-counter hemorrhoidal remedies which have not helped.  She reports that her symptoms are slightly better compared to the past but she would like to get rid of the hemorrhoids.  She does spend about 15 to 25 minutes on the toilet bowel for bowel movement.  She drinks sodas every day, does consume white bread regularly, red meat intermittently.  She has history of bipolar, on lithium.  She has tried over-the-counter stool softeners without much help.  She lives alone.  She does smoke 1 pack/day Denies alcohol use She is on disability due to mental illness  Follow-up visit 07/28/2019 Patient underwent colonoscopy which was fairly unremarkable.  She reports that her constipation is better regulated.  She does have intermittent diarrhea.  She denies rectal bleeding.  She had elevated LFTs and underwent right upper ultrasound which revealed fatty liver.  Patient does not have any other concerns today  Follow-up visit 01/23/2020 Patient reports that she has been going through tremendous stress in her personal life.  She has gained more than 15 pounds since last visit.  She reports  drinking sodas daily and also snacking.  She denies constipation or rectal bleeding.  She denies any GI symptoms today  Follow-up visit 08/09/20 Patient made a follow-up visit due to worsening of constipation as well as rectal discomfort, rectal bleeding secondary to flareup of hemorrhoids.  She states that she is moving out of her house in the next 18 days, during which she is going it has been very stressful.  She is not following high-fiber diet, continues to drink carbonated beverages, drinks about a liter of water daily.  She has seen neurology for headaches, found to have severe vitamin D deficiency as well as B12 deficiency.  She is on vitamin D supplements here.  She is also taking magnesium supplements and coenzyme Q.  She had 3 bowel movements today, describes on Bristol stool scale as 1/2 Her most recent liver enzymes were also normal except for mildly elevated alkaline phosphatase secondary to severe vitamin D deficiency  NSAIDs: None  Antiplts/Anticoagulants/Anti thrombotics: None  GI Procedures:  Colonoscopy 06/03/19 - Preparation of the colon was fair. - Perianal skin tags found on perianal exam. - Erythematous mucosa in the mid rectum. Biopsied. - Normal mucosa in the entire examined colon. - The distal rectum and anal verge are normal on retroflexion view.  DIAGNOSIS:  A. RECTUM, RANDOM; COLD BIOPSY:  - BENIGN RECTAL MUCOSA WITH SUPERFICIAL REACTIVE CHANGES AND MILD  ERYTHEMA.  - NEGATIVE FOR FEATURES OF MICROSCOPIC PROCTITIS.  - NEGATIVE FOR DYSPLASIA AND MALIGNANCY. She denies family history of GI malignancy in first-degree relatives Aunt has history of colon cancer  Past  Medical History:  Diagnosis Date  . Bipolar 1 disorder with moderate mania ([facility])   . Endometriosis   . Mental health problem   . Rectal bleeding     Past Surgical History:  Procedure Laterality Date  . CESAREAN SECTION    . CHOLECYSTECTOMY    . COLONOSCOPY WITH PROPOFOL N/A 06/03/2019    Procedure: COLONOSCOPY WITH PROPOFOL;  Surgeon: [name], MD;  Location: [facility] ENDOSCOPY;  Service: Gastroenterology;  Laterality: N/A;  . GALLBLADDER SURGERY    . laparoscopic supracervical hysterectomy  2011     Current Outpatient Medications:  .  alprazolam (XANAX) 2 MG tablet, , Disp: , Rfl:  .  BAC 50-325-40 MG tablet, Take 1-2 tablet by mouth every four hours as needed for migraine, do not exceed 6 in 24 hours., Disp: , Rfl:  .  cloNIDine (CATAPRES) 0.2 MG tablet, Take 0.2 mg by mouth at bedtime., Disp: , Rfl:  .  clotrimazole (LOTRIMIN) 1 % cream, Apply  as directed to affected area twice a day  for fungal infection, Disp: , Rfl:  .  Coenzyme Q10 100 MG TABS, Take 1 tablet by mouth once a day  for migraine prevention, Disp: , Rfl:  .  Deutetrabenazine (AUSTEDO PO), Take 60 mg by mouth 2 (two) times daily., Disp: , Rfl:  .  FLOVENT HFA 110 MCG/ACT inhaler, Inhale 2 puffs into the lungs 2 (two) times daily., Disp: , Rfl:  .  hydrocortisone (ANUSOL-HC) 2.5 % rectal cream, Place 1 application rectally 2 (two) times daily., Disp: 30 g, Rfl: 0 .  lisinopril-hydrochlorothiazide (ZESTORETIC) 20-25 MG tablet, Take 1 tablet by mouth daily., Disp: , Rfl:  .  lurasidone (LATUDA) 80 MG TABS tablet, Take 80 mg by mouth daily with breakfast., Disp: , Rfl:  .  Magnesium Oxide 400 MG CAPS, Take 1 capsule by mouth once a day  for headaches, Disp: , Rfl:  .  nortriptyline (PAMELOR) 10 MG capsule, Take by mouth., Disp: , Rfl:  .  QUEtiapine Fumarate (SEROQUEL PO), Take 10 mg by mouth daily., Disp: , Rfl:  .  Vitamin D, Ergocalciferol, (DRISDOL) 1.25 MG (50000 UNIT) CAPS capsule, Take 1 capsule (50,000 Units total) by mouth every 7 (seven) days., Disp: 16 capsule, Rfl: 0   Family History  Problem Relation Age of Onset  . Skin cancer Mother   . Diabetes Father   . Hyperthyroidism Father      Social History   Tobacco Use  . Smoking status: Current Every Day Smoker    Packs/day: 1.00     Types: Cigarettes  . Smokeless tobacco: Never Used  Vaping Use  . Vaping Use: Never used  Substance Use Topics  . Alcohol use: Not Currently  . Drug use: Yes    Types: Marijuana    Comment: once a month    Allergies as of 08/09/2020 - Review Complete 08/09/2020  Allergen Reaction Noted  . Morphine and related Nausea And Vomiting 12/24/2015  . Sulfa antibiotics Hives 12/24/2015  . Adhesive [tape] Rash 12/24/2015  . Elemental sulfur Rash 01/16/2017    Review of Systems:    All systems reviewed and negative except where noted in HPI.   Physical Exam:  BP 105/70 (BP Location: Left Arm, Patient Position: Sitting, Cuff Size: Large)   Pulse (!) 108   Temp 98.1 F (36.7 C) (Oral)   Ht 5\' 4"  (1.626 m)   Wt 242 lb 4 oz (109.9 kg)   BMI 41.58 kg/m  No LMP recorded.  Patient has had a hysterectomy.  General:   Alert, ingrown, well-developed, well-nourished, pleasant and cooperative in NAD Head:  Normocephalic and atraumatic. Eyes:  Sclera clear, no icterus.   Conjunctiva pink. Ears:  Normal auditory acuity. Nose:  No deformity, discharge, or lesions. Mouth:  No deformity or lesions,oropharynx pink & moist. Neck:  Supple; no masses or thyromegaly. Lungs:  Respirations even and unlabored.  Clear throughout to auscultation.   No wheezes, crackles, or rhonchi. No acute distress. Heart:  Regular rate and rhythm; no murmurs, clicks, rubs, or gallops. Abdomen:  Normal bowel sounds. Soft, obese, non-tender and non-distended without masses, hepatosplenomegaly or hernias noted.  No guarding or rebound tenderness.   Rectal: Not performed Msk:  Symmetrical without gross deformities. Good, equal movement & strength bilaterally. Pulses:  Normal pulses noted. Extremities:  No clubbing or edema.  No cyanosis. Neurologic:  Alert and oriented x3;  grossly normal neurologically. Skin:  Intact without significant lesions or rashes. No jaundice. Psych:  Alert and cooperative. Normal mood and  affect.  Imaging Studies: No abdominal imaging  Assessment and Plan:   MAKAILA WINDLE is a 45 y.o. female with chronic constipation and symptomatic external hemorrhoids, fatty liver  Chronic constipation, worsening TSH normal Reiterated on high-fiber diet Trial of Linzess 145 MCG daily, samples provided  Rectal bleeding No evidence of anemia Colonoscopy revealed erythematous mucosa in the rectum, biopsies did not reveal any evidence of ulcerative proctitis.  Otherwise, unremarkable  Grade 2 symptomatic external hemorrhoids, currently asymptomatic Advised her about toilet hygiene Reiterated not to spend more than 5 minutes on the toilet Defer hemorrhoid ligation at this time Trial of Anusol cream twice daily  Elevated LFTs secondary to fatty liver: Currently normalized Acute viral hepatitis panel negative Reiterated on healthy living, weight control  Severe vitamin D deficiency Recommend vitamin D 50,000 units weekly for 4 months Recheck levels by PCP in 3 months  Follow up as needed   Tabitha Repress, MD

## 2021-02-03 ENCOUNTER — Other Ambulatory Visit: Payer: Self-pay | Admitting: Student

## 2021-02-03 ENCOUNTER — Other Ambulatory Visit: Payer: Self-pay | Admitting: Physician Assistant

## 2021-02-03 DIAGNOSIS — E538 Deficiency of other specified B group vitamins: Secondary | ICD-10-CM

## 2021-02-03 DIAGNOSIS — R519 Headache, unspecified: Secondary | ICD-10-CM

## 2021-02-03 DIAGNOSIS — R110 Nausea: Secondary | ICD-10-CM

## 2021-02-03 DIAGNOSIS — R202 Paresthesia of skin: Secondary | ICD-10-CM

## 2021-02-03 DIAGNOSIS — R200 Anesthesia of skin: Secondary | ICD-10-CM

## 2021-02-05 IMAGING — US US ABDOMEN LIMITED
1 series · 14 of 25 positions shown · non-contrast
Comparison: None.

CLINICAL DATA: Elevated liver function tests.  Known fatty liver.

EXAM:
ULTRASOUND ABDOMEN LIMITED RIGHT UPPER QUADRANT

[Series 1: us abdomen limited · 14 of 27 slices shown]
[im 1/27]
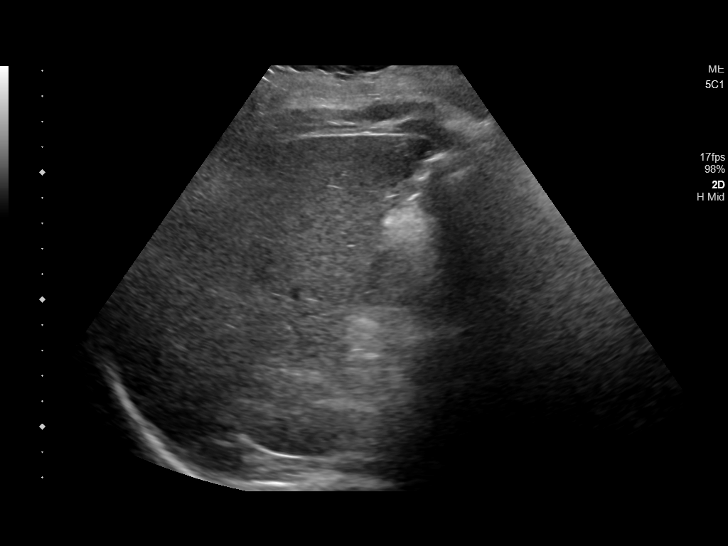
[im 3/27]
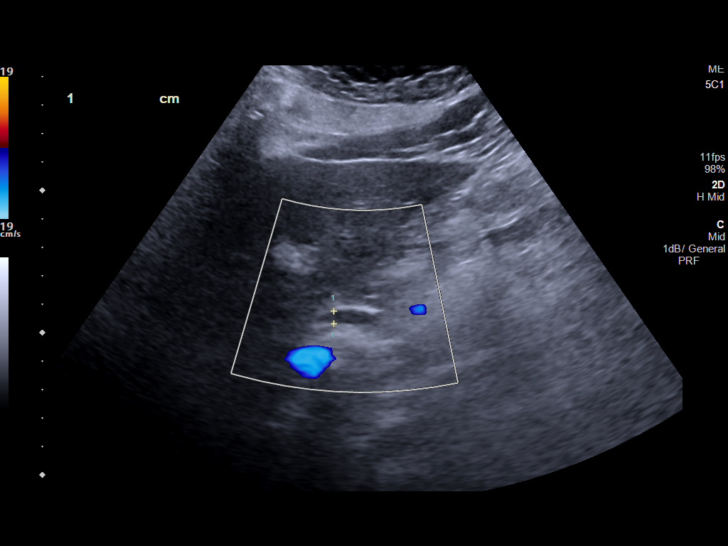
[im 5/27]
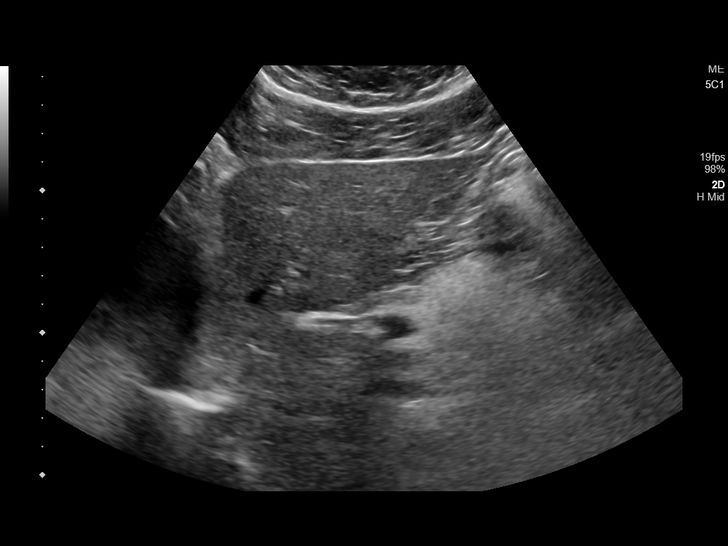
[im 7/27]
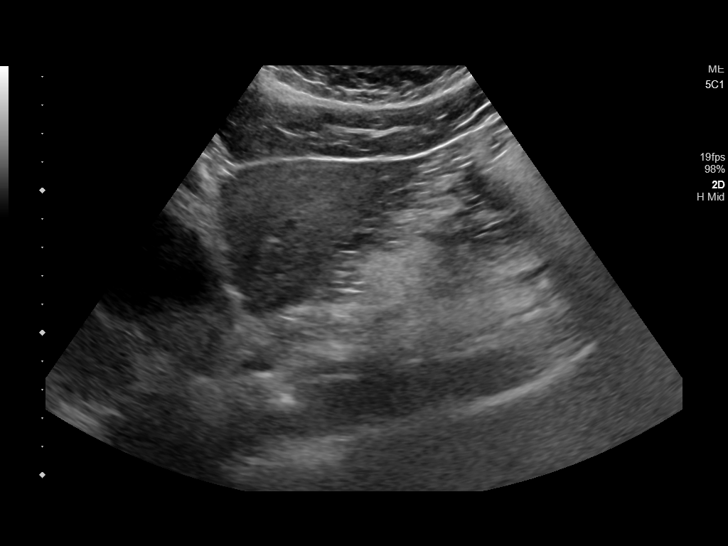
[im 9/27]
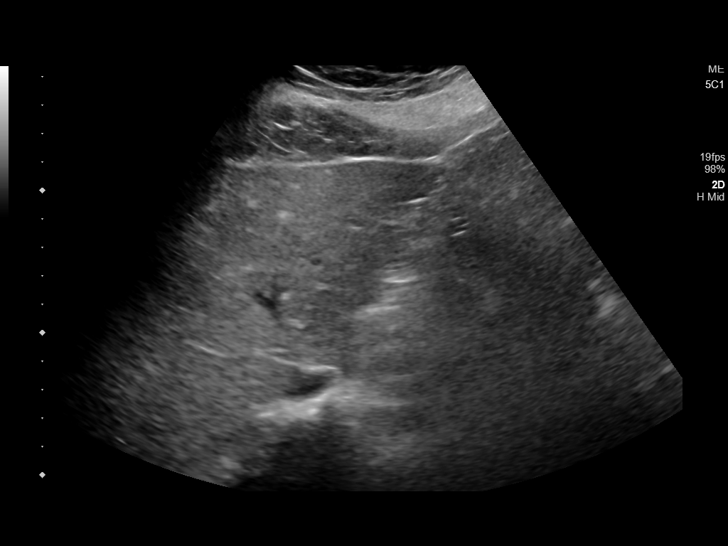
[im 10/27]
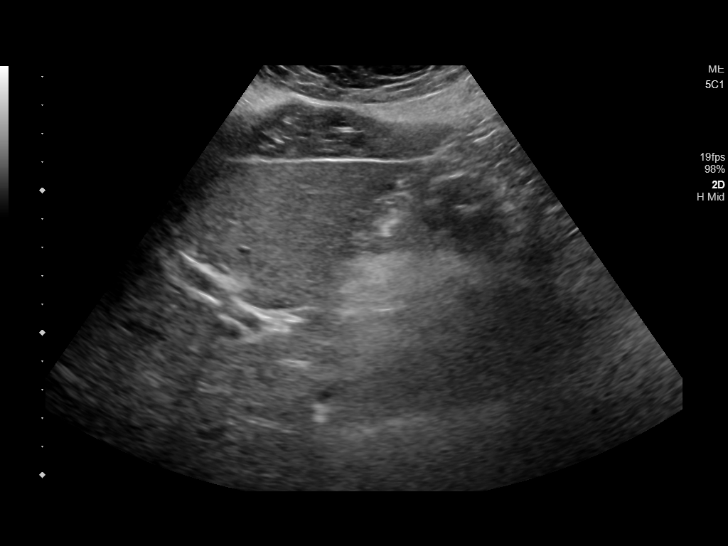
[im 12/27]
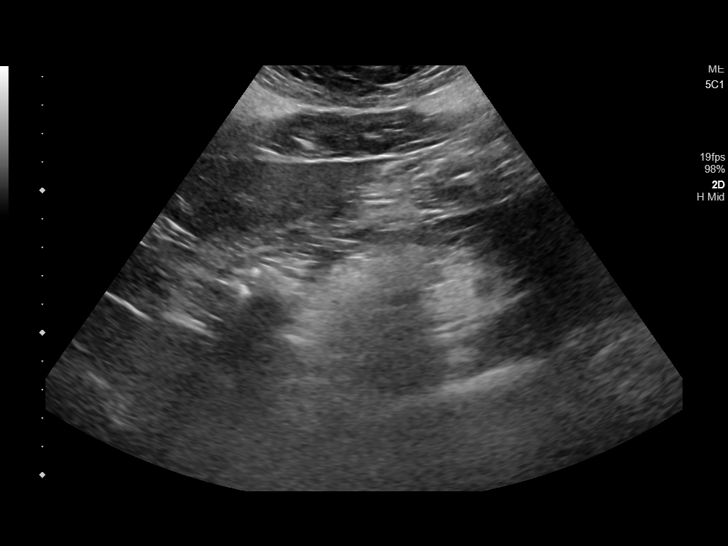
[im 15/27]
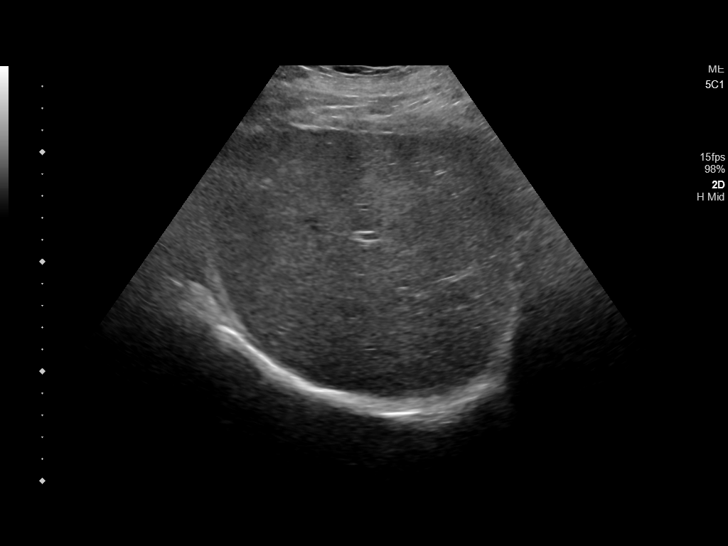
[im 17/27]
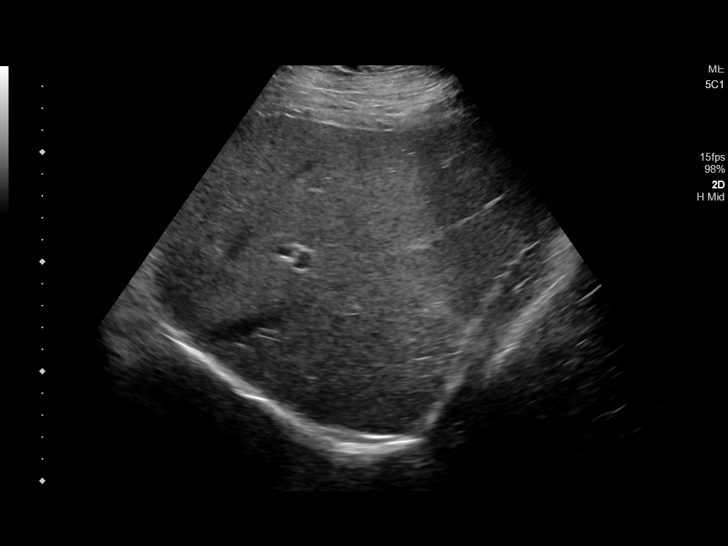
[im 18/27]
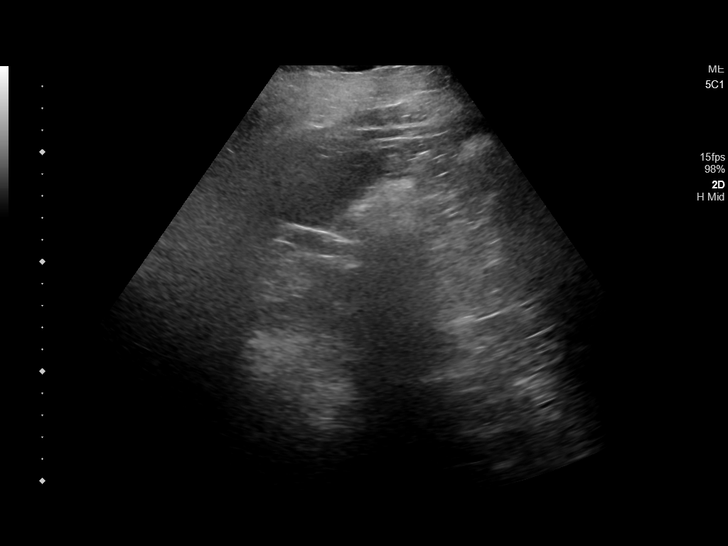
[im 20/27]
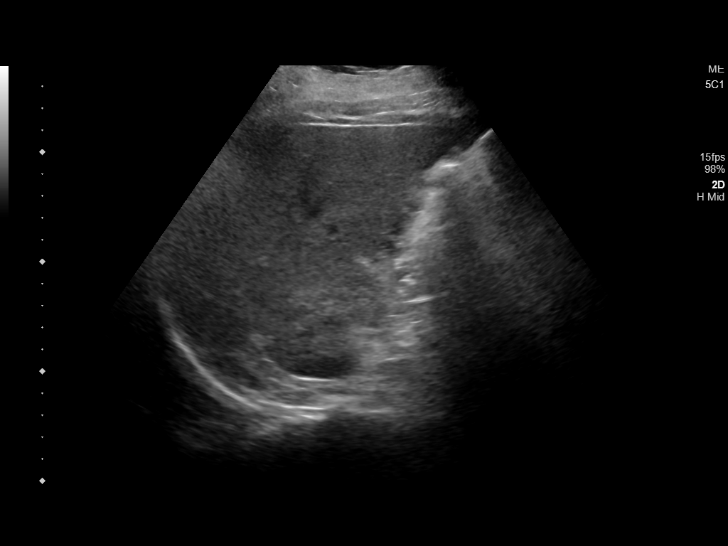
[im 22/27]
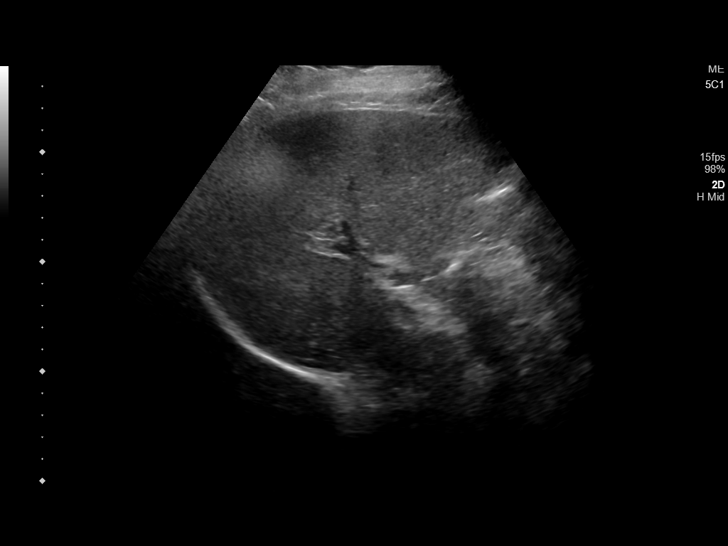
[im 24/27]
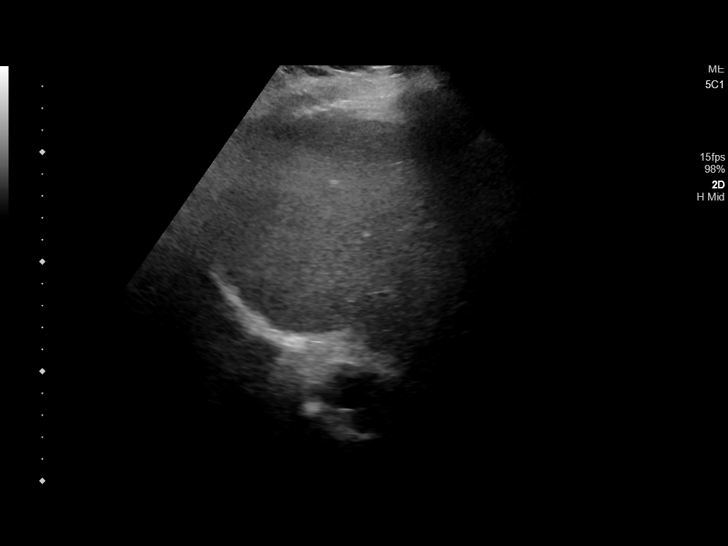
[im 27/27]
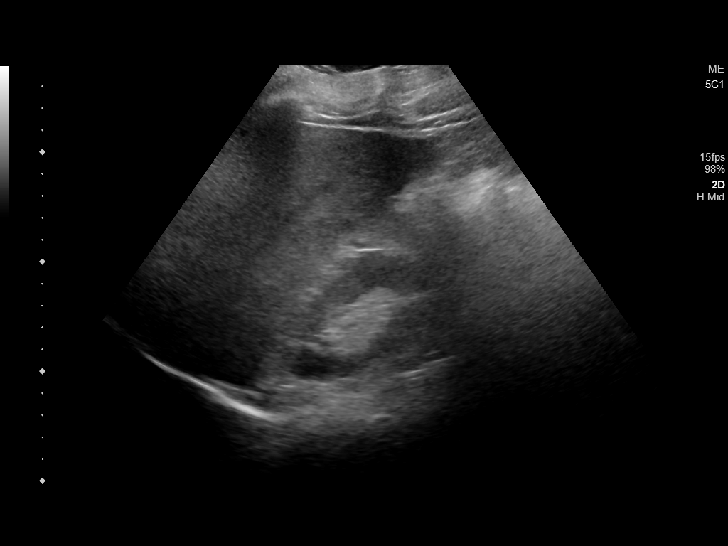

[14 of 25 positions shown; findings below may reference images not displayed]

FINDINGS: Gallbladder:

Surgically absent

Common bile duct:

Diameter: Normal, 5 mm

Liver:

Heterogeneously increased hepatic echogenicity. Portal vein is
patent on color Doppler imaging with normal direction of blood flow
towards the liver.

Other: No ascites.
IMPRESSION: 1. Heterogeneously increased hepatic echogenicity. Most likely
related to heterogeneous steatosis. If there is any prior imaging of
the liver, this should be reviewed to confirm stability. If not, and
there is any clinical concern of liver disease or history of primary
malignancy, pre and post contrast abdominal MRI should be considered
to exclude less likely infiltrating processes.
2.  Cholecystectomy, without biliary ductal dilatation.

## 2021-02-14 ENCOUNTER — Other Ambulatory Visit: Payer: Self-pay | Admitting: Gastroenterology

## 2021-02-15 ENCOUNTER — Ambulatory Visit
Admission: RE | Admit: 2021-02-15 | Discharge: 2021-02-15 | Disposition: A | Discharge status: Home / Self Care | Payer: Medicare HMO | Source: Ambulatory Visit | Attending: Physician Assistant | Admitting: Physician Assistant

## 2021-02-15 DIAGNOSIS — R110 Nausea: Secondary | ICD-10-CM | POA: Diagnosis present

## 2021-02-15 DIAGNOSIS — R200 Anesthesia of skin: Secondary | ICD-10-CM | POA: Diagnosis present

## 2021-02-15 DIAGNOSIS — E538 Deficiency of other specified B group vitamins: Secondary | ICD-10-CM | POA: Diagnosis present

## 2021-02-15 DIAGNOSIS — R519 Headache, unspecified: Secondary | ICD-10-CM

## 2021-02-15 DIAGNOSIS — R202 Paresthesia of skin: Secondary | ICD-10-CM | POA: Diagnosis present

## 2021-03-01 DIAGNOSIS — N1832 Chronic kidney disease, stage 3b: Secondary | ICD-10-CM | POA: Insufficient documentation

## 2021-04-11 ENCOUNTER — Other Ambulatory Visit: Payer: Self-pay

## 2021-04-11 ENCOUNTER — Other Ambulatory Visit
Admission: RE | Admit: 2021-04-11 | Discharge: 2021-04-11 | Disposition: A | Discharge status: Home / Self Care | Payer: Medicare HMO | Source: Ambulatory Visit | Attending: Neurology | Admitting: Neurology

## 2021-04-11 DIAGNOSIS — Z20822 Contact with and (suspected) exposure to covid-19: Secondary | ICD-10-CM | POA: Insufficient documentation

## 2021-04-11 DIAGNOSIS — Z01812 Encounter for preprocedural laboratory examination: Secondary | ICD-10-CM | POA: Insufficient documentation

## 2021-04-12 ENCOUNTER — Ambulatory Visit: Payer: Medicare HMO | Attending: Otolaryngology

## 2021-04-12 DIAGNOSIS — G4733 Obstructive sleep apnea (adult) (pediatric): Secondary | ICD-10-CM | POA: Insufficient documentation

## 2021-04-12 DIAGNOSIS — R519 Headache, unspecified: Secondary | ICD-10-CM | POA: Diagnosis not present

## 2021-04-12 DIAGNOSIS — R0683 Snoring: Secondary | ICD-10-CM | POA: Insufficient documentation

## 2021-04-12 LAB — SARS CORONAVIRUS 2 (TAT 6-24 HRS): SARS Coronavirus 2: NEGATIVE

## 2021-04-13 ENCOUNTER — Other Ambulatory Visit: Payer: Self-pay

## 2021-05-17 ENCOUNTER — Other Ambulatory Visit: Payer: Self-pay

## 2021-05-17 ENCOUNTER — Encounter: Payer: Self-pay | Admitting: Student in an Organized Health Care Education/Training Program

## 2021-05-17 ENCOUNTER — Ambulatory Visit
Payer: Medicare HMO | Attending: Student in an Organized Health Care Education/Training Program | Admitting: Student in an Organized Health Care Education/Training Program

## 2021-05-17 VITALS — BP 102/80 | HR 88 | Temp 97.5°F | Resp 16 | Ht 64.5 in | Wt 266.4 lb

## 2021-05-17 DIAGNOSIS — G894 Chronic pain syndrome: Secondary | ICD-10-CM | POA: Insufficient documentation

## 2021-05-17 DIAGNOSIS — F1290 Cannabis use, unspecified, uncomplicated: Secondary | ICD-10-CM | POA: Insufficient documentation

## 2021-05-17 DIAGNOSIS — M4310 Spondylolisthesis, site unspecified: Secondary | ICD-10-CM | POA: Diagnosis present

## 2021-05-17 DIAGNOSIS — M47816 Spondylosis without myelopathy or radiculopathy, lumbar region: Secondary | ICD-10-CM | POA: Diagnosis present

## 2021-05-17 DIAGNOSIS — M4316 Spondylolisthesis, lumbar region: Secondary | ICD-10-CM | POA: Diagnosis present

## 2021-05-17 DIAGNOSIS — M4300 Spondylolysis, site unspecified: Secondary | ICD-10-CM | POA: Insufficient documentation

## 2021-05-17 DIAGNOSIS — M48061 Spinal stenosis, lumbar region without neurogenic claudication: Secondary | ICD-10-CM | POA: Insufficient documentation

## 2021-05-17 MED ORDER — TIZANIDINE HCL 4 MG PO TABS: 2.0000 mg | ORAL_TABLET | Freq: Two times a day (BID) | ORAL | 0 refills | Status: AC | PRN

## 2021-05-17 NOTE — Patient Instructions (Signed)
GENERAL RISKS AND COMPLICATIONS  What are the risk, side effects and possible complications? Generally speaking, most procedures are safe.  However, with any procedure there are risks, side effects, and the possibility of complications.  The risks and complications are dependent upon the sites that are lesioned, or the type of nerve block to be performed.  The closer the procedure is to the spine, the more serious the risks are.  Great care is taken when placing the radio frequency needles, block needles or lesioning probes, but sometimes complications can occur. Infection: Any time there is an injection through the skin, there is a risk of infection.  This is why sterile conditions are used for these blocks.  There are four possible types of infection. Localized skin infection. Central Nervous System Infection-This can be in the form of Meningitis, which can be deadly. Epidural Infections-This can be in the form of an epidural abscess, which can cause pressure inside of the spine, causing compression of the spinal cord with subsequent paralysis. This would require an emergency surgery to decompress, and there are no guarantees that the patient would recover from the paralysis. Discitis-This is an infection of the intervertebral discs.  It occurs in about 1% of discography procedures.  It is difficult to treat and it may lead to surgery.        2. Pain: the needles have to go through skin and soft tissues, will cause soreness.       3. Damage to internal structures:  The nerves to be lesioned may be near blood vessels or    other nerves which can be potentially damaged.       4. Bleeding: Bleeding is more common if the patient is taking blood thinners such as  aspirin, Coumadin, Ticiid, Plavix, etc., or if he/she have some genetic predisposition  such as hemophilia. Bleeding into the spinal canal can cause compression of the spinal  cord with subsequent paralysis.  This would require an emergency  surgery to  decompress and there are no guarantees that the patient would recover from the  paralysis.       5. Pneumothorax:  Puncturing of a lung is a possibility, every time a needle is introduced in  the area of the chest or upper back.  Pneumothorax refers to free air around the  collapsed lung(s), inside of the thoracic cavity (chest cavity).  Another two possible  complications related to a similar event would include: Hemothorax and Chylothorax.   These are variations of the Pneumothorax, where instead of air around the collapsed  lung(s), you may have blood or chyle, respectively.       6. Spinal headaches: They may occur with any procedures in the area of the spine.       7. Persistent CSF (Cerebro-Spinal Fluid) leakage: This is a rare problem, but may occur  with prolonged intrathecal or epidural catheters either due to the formation of a fistulous  track or a dural tear.       8. Nerve damage: By working so close to the spinal cord, there is always a possibility of  nerve damage, which could be as serious as a permanent spinal cord injury with  paralysis.       9. Death:  Although rare, severe deadly allergic reactions known as "Anaphylactic  reaction" can occur to any of the medications used.      10. Worsening of the symptoms:  We can always make thing worse.  What are the chances   of something like this happening? Chances of any of this occuring are extremely low.  By statistics, you have more of a chance of getting killed in a motor vehicle accident: while driving to the hospital than any of the above occurring .  Nevertheless, you should be aware that they are possibilities.  In general, it is similar to taking a shower.  Everybody knows that you can slip, hit your head and get killed.  Does that mean that you should not shower again?  Nevertheless always keep in mind that statistics do not mean anything if you happen to be on the wrong side of them.  Even if a procedure has a 1 (one) in a  1,000,000 (million) chance of going wrong, it you happen to be that one..Also, keep in mind that by statistics, you have more of a chance of having something go wrong when taking medications.  Who should not have this procedure? If you are on a blood thinning medication (e.g. Coumadin, Plavix, see list of "Blood Thinners"), or if you have an active infection going on, you should not have the procedure.  If you are taking any blood thinners, please inform your physician.  How should I prepare for this procedure? Do not eat or drink anything at least six hours prior to the procedure. Bring a driver with you .  It cannot be a taxi. Come accompanied by an adult that can drive you back, and that is strong enough to help you if your legs get weak or numb from the local anesthetic. Take all of your medicines the morning of the procedure with just enough water to swallow them. If you have diabetes, make sure that you are scheduled to have your procedure done first thing in the morning, whenever possible. If you have diabetes, take only half of your insulin dose and notify our nurse that you have done so as soon as you arrive at the clinic. If you are diabetic, but only take blood sugar pills (oral hypoglycemic), then do not take them on the morning of your procedure.  You may take them after you have had the procedure. Do not take aspirin or any aspirin-containing medications, at least eleven (11) days prior to the procedure.  They may prolong bleeding. Wear loose fitting clothing that may be easy to take off and that you would not mind if it got stained with Betadine or blood. Do not wear any jewelry or perfume Remove any nail coloring.  It will interfere with some of our monitoring equipment.  NOTE: Remember that this is not meant to be interpreted as a complete list of all possible complications.  Unforeseen problems may occur.  BLOOD THINNERS The following drugs contain aspirin or other products,  which can cause increased bleeding during surgery and should not be taken for 2 weeks prior to and 1 week after surgery.  If you should need take something for relief of minor pain, you may take acetaminophen which is found in Tylenol,m Datril, Anacin-3 and Panadol. It is not blood thinner. The products listed below are.  Do not take any of the products listed below in addition to any listed on your instruction sheet.  A.P.C or A.P.C with Codeine Codeine Phosphate Capsules #3 Ibuprofen Ridaura  ABC compound Congesprin Imuran rimadil  Advil Cope Indocin Robaxisal  Alka-Seltzer Effervescent Pain Reliever and Antacid Coricidin or Coricidin-D  Indomethacin Rufen  Alka-Seltzer plus Cold Medicine Cosprin Ketoprofen S-A-C Tablets  Anacin Analgesic Tablets or Capsules Coumadin   Korlgesic Salflex  Anacin Extra Strength Analgesic tablets or capsules CP-2 Tablets Lanoril Salicylate  Anaprox Cuprimine Capsules Levenox Salocol  Anexsia-D Dalteparin Magan Salsalate  Anodynos Darvon compound Magnesium Salicylate Sine-off  Ansaid Dasin Capsules Magsal Sodium Salicylate  Anturane Depen Capsules Marnal Soma  APF Arthritis pain formula Dewitt's Pills Measurin Stanback  Argesic Dia-Gesic Meclofenamic Sulfinpyrazone  Arthritis Bayer Timed Release Aspirin Diclofenac Meclomen Sulindac  Arthritis pain formula Anacin Dicumarol Medipren Supac  Analgesic (Safety coated) Arthralgen Diffunasal Mefanamic Suprofen  Arthritis Strength Bufferin Dihydrocodeine Mepro Compound Suprol  Arthropan liquid Dopirydamole Methcarbomol with Aspirin Synalgos  ASA tablets/Enseals Disalcid Micrainin Tagament  Ascriptin Doan's Midol Talwin  Ascriptin A/D Dolene Mobidin Tanderil  Ascriptin Extra Strength Dolobid Moblgesic Ticlid  Ascriptin with Codeine Doloprin or Doloprin with Codeine Momentum Tolectin  Asperbuf Duoprin Mono-gesic Trendar  Aspergum Duradyne Motrin or Motrin IB Triminicin  Aspirin plain, buffered or enteric coated  Durasal Myochrisine Trigesic  Aspirin Suppositories Easprin Nalfon Trillsate  Aspirin with Codeine Ecotrin Regular or Extra Strength Naprosyn Uracel  Atromid-S Efficin Naproxen Ursinus  Auranofin Capsules Elmiron Neocylate Vanquish  Axotal Emagrin Norgesic Verin  Azathioprine Empirin or Empirin with Codeine Normiflo Vitamin E  Azolid Emprazil Nuprin Voltaren  Bayer Aspirin plain, buffered or children's or timed BC Tablets or powders Encaprin Orgaran Warfarin Sodium  Buff-a-Comp Enoxaparin Orudis Zorpin  Buff-a-Comp with Codeine Equegesic Os-Cal-Gesic   Buffaprin Excedrin plain, buffered or Extra Strength Oxalid   Bufferin Arthritis Strength Feldene Oxphenbutazone   Bufferin plain or Extra Strength Feldene Capsules Oxycodone with Aspirin   Bufferin with Codeine Fenoprofen Fenoprofen Pabalate or Pabalate-SF   Buffets II Flogesic Panagesic   Buffinol plain or Extra Strength Florinal or Florinal with Codeine Panwarfarin   Buf-Tabs Flurbiprofen Penicillamine   Butalbital Compound Four-way cold tablets Penicillin   Butazolidin Fragmin Pepto-Bismol   Carbenicillin Geminisyn Percodan   Carna Arthritis Reliever Geopen Persantine   Carprofen Gold's salt Persistin   Chloramphenicol Goody's Phenylbutazone   Chloromycetin Haltrain Piroxlcam   Clmetidine heparin Plaquenil   Cllnoril Hyco-pap Ponstel   Clofibrate Hydroxy chloroquine Propoxyphen         Before stopping any of these medications, be sure to consult the physician who ordered them.  Some, such as Coumadin (Warfarin) are ordered to prevent or treat serious conditions such as "deep thrombosis", "pumonary embolisms", and other heart problems.  The amount of time that you may need off of the medication may also vary with the medication and the reason for which you were taking it.  If you are taking any of these medications, please make sure you notify your pain physician before you undergo any procedures.         Facet Blocks Patient  Information  Description: The facets are joints in the spine between the vertebrae.  Like any joints in the body, facets can become irritated and painful.  Arthritis can also effect the facets.  By injecting steroids and local anesthetic in and around these joints, we can temporarily block the nerve supply to them.  Steroids act directly on irritated nerves and tissues to reduce selling and inflammation which often leads to decreased pain.  Facet blocks may be done anywhere along the spine from the neck to the low back depending upon the location of your pain.   After numbing the skin with local anesthetic (like Novocaine), a small needle is passed onto the facet joints under x-ray guidance.  You may experience a sensation of pressure while this is being done.  The   entire block usually lasts about 15-25 minutes.   Conditions which may be treated by facet blocks:  Low back/buttock pain Neck/shoulder pain Certain types of headaches  Preparation for the injection:  Do not eat any solid food or dairy products within 8 hours of your appointment. You may drink clear liquid up to 3 hours before appointment.  Clear liquids include water, black coffee, juice or soda.  No milk or cream please. You may take your regular medication, including pain medications, with a sip of water before your appointment.  Diabetics should hold regular insulin (if taken separately) and take 1/2 normal NPH dose the morning of the procedure.  Carry some sugar containing items with you to your appointment. A driver must accompany you and be prepared to drive you home after your procedure. Bring all your current medications with you. An IV may be inserted and sedation may be given at the discretion of the physician. A blood pressure cuff, EKG and other monitors will often be applied during the procedure.  Some patients may need to have extra oxygen administered for a short period. You will be asked to provide medical information,  including your allergies and medications, prior to the procedure.  We must know immediately if you are taking blood thinners (like Coumadin/Warfarin) or if you are allergic to IV iodine contrast (dye).  We must know if you could possible be pregnant.  Possible side-effects:  Bleeding from needle site Infection (rare, may require surgery) Nerve injury (rare) Numbness & tingling (temporary) Difficulty urinating (rare, temporary) Spinal headache (a headache worse with upright posture) Light-headedness (temporary) Pain at injection site (serveral days) Decreased blood pressure (rare, temporary) Weakness in arm/leg (temporary) Pressure sensation in back/neck (temporary)   Call if you experience:  Fever/chills associated with headache or increased back/neck pain Headache worsened by an upright position New onset, weakness or numbness of an extremity below the injection site Hives or difficulty breathing (go to the emergency room) Inflammation or drainage at the injection site(s) Severe back/neck pain greater than usual New symptoms which are concerning to you  Please note:  Although the local anesthetic injected can often make your back or neck feel good for several hours after the injection, the pain will likely return. It takes 3-7 days for steroids to work.  You may not notice any pain relief for at least one week.  If effective, we will often do a series of 2-3 injections spaced 3-6 weeks apart to maximally decrease your pain.  After the initial series, you may be a candidate for a more permanent nerve block of the facets.  If you have any questions, please call [phone] [facility] 

## 2021-05-17 NOTE — Progress Notes (Signed)
Safety precautions to be maintained throughout the outpatient stay will include: orient to surroundings, keep bed in low position, maintain call bell within reach at all times, provide assistance with transfer out of bed and ambulation.  
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
## 2021-05-20 DIAGNOSIS — G4733 Obstructive sleep apnea (adult) (pediatric): Secondary | ICD-10-CM | POA: Insufficient documentation

## 2021-05-25 ENCOUNTER — Ambulatory Visit (HOSPITAL_BASED_OUTPATIENT_CLINIC_OR_DEPARTMENT_OTHER): Payer: Medicare HMO | Admitting: Student in an Organized Health Care Education/Training Program

## 2021-05-25 ENCOUNTER — Other Ambulatory Visit: Payer: Self-pay

## 2021-05-25 ENCOUNTER — Ambulatory Visit
Admission: RE | Admit: 2021-05-25 | Discharge: 2021-05-25 | Disposition: A | Discharge status: Home / Self Care | Payer: Medicare HMO | Source: Ambulatory Visit | Attending: Student in an Organized Health Care Education/Training Program | Admitting: Student in an Organized Health Care Education/Training Program

## 2021-05-25 ENCOUNTER — Encounter: Payer: Self-pay | Admitting: Student in an Organized Health Care Education/Training Program

## 2021-05-25 DIAGNOSIS — M47816 Spondylosis without myelopathy or radiculopathy, lumbar region: Secondary | ICD-10-CM | POA: Diagnosis present

## 2021-05-25 DIAGNOSIS — G894 Chronic pain syndrome: Secondary | ICD-10-CM

## 2021-05-25 DIAGNOSIS — M4316 Spondylolisthesis, lumbar region: Secondary | ICD-10-CM | POA: Diagnosis present

## 2021-05-25 MED ORDER — LIDOCAINE HCL 2 % IJ SOLN
20.0000 mL | Freq: Once | INTRAMUSCULAR | Status: AC
  Administered 2021-05-25: 12:00:00 400 mg

## 2021-05-25 MED ORDER — DEXAMETHASONE SODIUM PHOSPHATE 10 MG/ML IJ SOLN
10.0000 mg | Freq: Once | INTRAMUSCULAR | Status: AC
  Administered 2021-05-25: 12:00:00 10 mg

## 2021-05-25 MED ORDER — ROPIVACAINE HCL 2 MG/ML IJ SOLN
INTRAMUSCULAR | Status: AC
  Filled 2021-05-25: qty 20

## 2021-05-25 MED ORDER — DEXAMETHASONE SODIUM PHOSPHATE 10 MG/ML IJ SOLN
INTRAMUSCULAR | Status: AC
  Filled 2021-05-25: qty 2

## 2021-05-25 MED ORDER — LIDOCAINE HCL 2 % IJ SOLN
INTRAMUSCULAR | Status: AC
  Filled 2021-05-25: qty 20

## 2021-05-25 MED ORDER — ROPIVACAINE HCL 2 MG/ML IJ SOLN
9.0000 mL | Freq: Once | INTRAMUSCULAR | Status: AC
  Administered 2021-05-25: 12:00:00 9 mL via PERINEURAL

## 2021-05-25 NOTE — Progress Notes (Signed)
PROVIDER NOTE: Interpretation of information contained herein should be left to medically-trained personnel. Specific patient instructions are provided elsewhere under "Patient Instructions" section of medical record. This document was created in part using STT-dictation technology, any transcriptional errors that may result from this process are unintentional.  Patient: Tabitha Williams Type: Established DOB: 11/20/1975 MRN: [id number] PCP: [facility]  Service: Procedure DOS: 05/25/2021 Setting: Ambulatory Location: Ambulatory outpatient facility Delivery: Face-to-face Provider: Edward JollyBilal Chastin Riesgo, MD Specialty: Interventional Pain Management Specialty designation: 09 Location: Outpatient facility Ref. Prov.: Edward JollyLateef, Swathi Dauphin, MD    Primary Reason for Visit: Interventional Pain Management Treatment. CC: Back Pain   Procedure:           Type: Lumbar Facet, Medial Branch Block(s) #1  Laterality: Bilateral  Level: L3, L4, L5, Medial Branch Level(s). Injecting these levels blocks the L3-4 and L5-S1 lumbar facet joints. Imaging: Fluoroscopic guidance Anesthesia: Local anesthesia (1-2% Lidocaine) Anxiolysis: None                 Sedation: None. DOS: 05/25/2021 Performed by: Edward JollyBilal Zaniel Marineau, MD  Primary Purpose: Diagnostic/Therapeutic Indications: Low back pain severe enough to impact quality of life or function. 1. Lumbar facet arthropathy   2. Lumbar spondylosis   3. Spondylolisthesis at L4-L5 level   4. Chronic pain syndrome    NAS-11 Pain score:   Pre-procedure: 4 /10   Post-procedure: 5 /10     Position / Prep / Materials:  Position: Prone  Prep solution: DuraPrep (Iodine Povacrylex [0.7% available iodine] and Isopropyl Alcohol, 74% w/w) Area Prepped: Posterolateral Lumbosacral Spine (Wide prep: From the lower border of the scapula down to the end of the tailbone and from flank to flank.)  Materials:  Tray: Block Needle(s):  Type: Spinal  Gauge (G): 22   Length: 5-in Qty: 4  Pre-op H&P Assessment:  Tabitha Williams is a 46 y.o. (year old), female patient, seen today for interventional treatment. She  has a past surgical history that includes Cholecystectomy; laparoscopic supracervical hysterectomy (2011); Cesarean section; Gallbladder surgery; and Colonoscopy with propofol (N/A, 06/03/2019). Tabitha Williams has a current medication list which includes the following prescription(s): alprazolam, clonidine, clotrimazole, coenzyme q10, deutetrabenazine, flovent hfa, hydrocortisone, lisinopril-hydrochlorothiazide, lurasidone, magnesium oxide, nortriptyline, quetiapine fumarate, tizanidine, venlafaxine, vitamin d (ergocalciferol), and bac. Her primarily concern today is the Back Pain  Initial Vital Signs:  Pulse/HCG Rate:  (!) 110 ECG Heart Rate: (!) 109 Temp:   Resp: 16 BP: 122/81 SpO2: 100 %  BMI: Estimated body mass index is 44.95 kg/m as calculated from the following:   Height as of this encounter: 5' 4.5" (1.638 m).   Weight as of this encounter: 266 lb (120.7 kg).  Risk Assessment: Allergies: Reviewed. She is allergic to morphine and related, sulfa antibiotics, adhesive [tape], and elemental sulfur.  Allergy Precautions: None required Coagulopathies: Reviewed. None identified.  Blood-thinner therapy: None at this time Active Infection(s): Reviewed. None identified. Tabitha Williams is afebrile  Site Confirmation: Tabitha Williams was asked to confirm the procedure and laterality before marking the site Procedure checklist: Completed Consent: Before the procedure and under the influence of no sedative(s), amnesic(s), or anxiolytics, the patient was informed of the treatment options, risks and possible complications. To fulfill our ethical and legal obligations, as recommended by the American Medical Association's Code of Ethics, I have informed the patient of my clinical impression; the nature and purpose of the treatment or procedure; the  risks, benefits, and possible complications of the intervention; the alternatives, including doing nothing;  the risk(s) and benefit(s) of the alternative treatment(s) or procedure(s); and the risk(s) and benefit(s) of doing nothing. The patient was provided information about the general risks and possible complications associated with the procedure. These may include, but are not limited to: failure to achieve desired goals, infection, bleeding, organ or nerve damage, allergic reactions, paralysis, and death. In addition, the patient was informed of those risks and complications associated to Spine-related procedures, such as failure to decrease pain; infection (i.e.: Meningitis, epidural or intraspinal abscess); bleeding (i.e.: epidural hematoma, subarachnoid hemorrhage, or any other type of intraspinal or peri-dural bleeding); organ or nerve damage (i.e.: Any type of peripheral nerve, nerve root, or spinal cord injury) with subsequent damage to sensory, motor, and/or autonomic systems, resulting in permanent pain, numbness, and/or weakness of one or several areas of the body; allergic reactions; (i.e.: anaphylactic reaction); and/or death. Furthermore, the patient was informed of those risks and complications associated with the medications. These include, but are not limited to: allergic reactions (i.e.: anaphylactic or anaphylactoid reaction(s)); adrenal axis suppression; blood sugar elevation that in diabetics may result in ketoacidosis or comma; water retention that in patients with history of congestive heart failure may result in shortness of breath, pulmonary edema, and decompensation with resultant heart failure; weight gain; swelling or edema; medication-induced neural toxicity; particulate matter embolism and blood vessel occlusion with resultant organ, and/or nervous system infarction; and/or aseptic necrosis of one or more joints. Finally, the patient was informed that Medicine is not an exact  science; therefore, there is also the possibility of unforeseen or unpredictable risks and/or possible complications that may result in a catastrophic outcome. The patient indicated having understood very clearly. We have given the patient no guarantees and we have made no promises. Enough time was given to the patient to ask questions, all of which were answered to the patient's satisfaction. Ms. Turk has indicated that she wanted to continue with the procedure. Attestation: I, the ordering provider, attest that I have discussed with the patient the benefits, risks, side-effects, alternatives, likelihood of achieving goals, and potential problems during recovery for the procedure that I have provided informed consent. Date   Time: 05/25/2021 11:34 AM  Pre-Procedure Preparation:  Monitoring: As per clinic protocol. Respiration, ETCO2, SpO2, BP, heart rate and rhythm monitor placed and checked for adequate function Safety Precautions: Patient was assessed for positional comfort and pressure points before starting the procedure. Time-out: I initiated and conducted the "Time-out" before starting the procedure, as per protocol. The patient was asked to participate by confirming the accuracy of the "Time Out" information. Verification of the correct person, site, and procedure were performed and confirmed by me, the nursing staff, and the patient. "Time-out" conducted as per Joint Commission's Universal Protocol (UP.01.01.01). Time: 1157  Description of Procedure:          Laterality: Bilateral. The procedure was performed in identical fashion on both sides. Levels:  L3, L4, L5,  Medial Branch Level(s)  Safety Precautions: Aspiration looking for blood return was conducted prior to all injections. At no point did we inject any substances, as a needle was being advanced. Before injecting, the patient was told to immediately notify me if she was experiencing any new onset of "ringing in the ears, or  metallic taste in the mouth". No attempts were made at seeking any paresthesias. Safe injection practices and needle disposal techniques used. Medications properly checked for expiration dates. SDV (single dose vial) medications used. After the completion of the procedure, all disposable  equipment used was discarded in the proper designated medical waste containers. Local Anesthesia: Protocol guidelines were followed. The patient was positioned over the fluoroscopy table. The area was prepped in the usual manner. The time-out was completed. The target area was identified using fluoroscopy. A 12-in long, straight, sterile hemostat was used with fluoroscopic guidance to locate the targets for each level blocked. Once located, the skin was marked with an approved surgical skin marker. Once all sites were marked, the skin (epidermis, dermis, and hypodermis), as well as deeper tissues (fat, connective tissue and muscle) were infiltrated with a small amount of a short-acting local anesthetic, loaded on a 10cc syringe with a 25G, 1.5-in  Needle. An appropriate amount of time was allowed for local anesthetics to take effect before proceeding to the next step. Local Anesthetic: Lidocaine 2.0% The unused portion of the local anesthetic was discarded in the proper designated containers. Technical explanation of process:  L3 Medial Branch Nerve Block (MBB): The target area for the L3 medial branch is at the junction of the postero-lateral aspect of the superior articular process and the superior, posterior, and medial edge of the transverse process of L4. Under fluoroscopic guidance, a Quincke needle was inserted until contact was made with os over the superior postero-lateral aspect of the pedicular shadow (target area). After negative aspiration for blood, 61mL of the nerve block solution was injected without difficulty or complication. The needle was removed intact. L4 Medial Branch Nerve Block (MBB): The target area for  the L4 medial branch is at the junction of the postero-lateral aspect of the superior articular process and the superior, posterior, and medial edge of the transverse process of L5. Under fluoroscopic guidance, a Quincke needle was inserted until contact was made with os over the superior postero-lateral aspect of the pedicular shadow (target area). After negative aspiration for blood, 78mL of the nerve block solution was injected without difficulty or complication. The needle was removed intact. L5 Medial Branch Nerve Block (MBB): The target area for the L5 medial branch is at the junction of the postero-lateral aspect of the superior articular process and the superior, posterior, and medial edge of the sacral ala. Under fluoroscopic guidance, a Quincke needle was inserted until contact was made with os over the superior postero-lateral aspect of the pedicular shadow (target area). After negative aspiration for blood, 78mL of the nerve block solution was injected without difficulty or complication. The needle was removed intact. Once the entire procedure was completed, the treated area was cleaned, making sure to leave some of the prepping solution back to take advantage of its long term bactericidal properties.  12 cc solution made of 10 cc of 0.2% ropivacaine, 2 cc of Decadron 10 mg/cc. 2 cc injected at each level above        Illustration of the posterior view of the lumbar spine and the posterior neural structures. Laminae of L2 through S1 are labeled. DPRL5, dorsal primary ramus of L5; DPRS1, dorsal primary ramus of S1; DPR3, dorsal primary ramus of L3; FJ, facet (zygapophyseal) joint L3-L4; I, inferior articular process of L4; LB1, lateral branch of dorsal primary ramus of L1; IAB, inferior articular branches from L3 medial branch (supplies L4-L5 facet joint); IBP, intermediate branch plexus; MB3, medial branch of dorsal primary ramus of L3; NR3, third lumbar nerve root; S, superior articular  process of L5; SAB, superior articular branches from L4 (supplies L4-5 facet joint also); TP3, transverse process of L3.  Vitals:   05/25/21 1138 05/25/21  1155 05/25/21 1200  BP: 122/81 124/90 (!) 127/97  Pulse: (!) 110    Resp: 16 (!) 24 (!) 26  SpO2: 100% 97% 97%  Weight: 266 lb (120.7 kg)    Height: 5' 4.5" (1.638 m)       Start Time: 1157 hrs. End Time: 1211 hrs.  Imaging Guidance (Spinal):          Type of Imaging Technique: Fluoroscopy Guidance (Spinal) Indication(s): Assistance in needle guidance and placement for procedures requiring needle placement in or near specific anatomical locations not easily accessible without such assistance. Exposure Time: Please see nurses notes. Contrast: None used. Fluoroscopic Guidance: I was personally present during the use of fluoroscopy. "Tunnel Vision Technique" used to obtain the best possible view of the target area. Parallax error corrected before commencing the procedure. "Direction-depth-direction" technique used to introduce the needle under continuous pulsed fluoroscopy. Once target was reached, antero-posterior, oblique, and lateral fluoroscopic projection used confirm needle placement in all planes. Images permanently stored in EMR. Interpretation: No contrast injected. I personally interpreted the imaging intraoperatively. Adequate needle placement confirmed in multiple planes. Permanent images saved into the patient's record.  Post-operative Assessment:  Post-procedure Vital Signs:  Pulse/HCG Rate:  (!) 110 (!) 102 Temp:   Resp:  (!) 26 BP:  (!) 127/97 SpO2: 97 %  EBL: None  Complications: No immediate post-treatment complications observed by team, or reported by patient.  Note: The patient tolerated the entire procedure well. A repeat set of vitals were taken after the procedure and the patient was kept under observation following institutional policy, for this type of procedure. Post-procedural neurological assessment  was performed, showing return to baseline, prior to discharge. The patient was provided with post-procedure discharge instructions, including a section on how to identify potential problems. Should any problems arise concerning this procedure, the patient was given instructions to immediately contact us, at any time, without hesitation. In any case, we plan to contact the patient by telephone for a follow-up status report regarding this interventional procedure.  Comments:  No additional relevant information.  Plan of Care  Orders:  Orders Placed This Encounter  Procedures   DG PAIN [facility] C-ARM 1-60 MIN NO REPORT    Intraoperative interpretation by procedural physician at [facility].    Standing Status:   Standing    Number of Occurrences:   1    Order Specific Question:   Reason for exam:    Answer:   Assistance in needle guidance and placement for procedures requiring needle placement in or near specific anatomical locations not easily accessible without such assistance.    Medications ordered for procedure: Meds ordered this encounter  Medications   lidocaine (XYLOCAINE) 2 % (with pres) injection 400 mg   dexamethasone (DECADRON) injection 10 mg   dexamethasone (DECADRON) injection 10 mg   ropivacaine (PF) 2 mg/mL (0.2%) (NAROPIN) injection 9 mL   ropivacaine (PF) 2 mg/mL (0.2%) (NAROPIN) injection 9 mL   Medications administered: We administered lidocaine, dexamethasone, dexamethasone, ropivacaine (PF) 2 mg/mL (0.2%), and ropivacaine (PF) 2 mg/mL (0.2%).  See the medical record for exact dosing, route, and time of administration.  Follow-up plan:   Return in about 2 weeks (around 06/08/2021) for Post Procedure Evaluation, virtual.       B/L L3,4,5 Fct block #1   Recent Visits Date Type Provider Dept  05/17/21 Office Visit Edward JollyLateef, Future [place], MD [facility]  Showing recent visits within past 90 days and meeting all other requirements Today's Visits  Date Type  Provider Dept  05/25/21 Procedure visit Edward Jolly, MD [facility]  Showing today's visits and meeting all other requirements Future Appointments Date Type Provider Dept  06/09/21 Appointment Edward Jolly, MD [facility]  Showing future appointments within next 90 days and meeting all other requirements  Disposition: Discharge home  Discharge (Date   Time): 05/25/2021; 1225 hrs.   Primary Care Physician: [facility] Location: [facility] Note by: Edward Jolly, MD Date: 05/25/2021; Time: 12:40 PM  Disclaimer:  Medicine is not an exact science. The only guarantee in medicine is that nothing is guaranteed. It is important to note that the decision to proceed with this intervention was based on the information collected from the patient. The Data and conclusions were drawn from the patient's questionnaire, the interview, and the physical examination. Because the information was provided in large part by the patient, it cannot be guaranteed that it has not been purposely or unconsciously manipulated. Every effort has been made to obtain as much relevant data as possible for this evaluation. It is important to note that the conclusions that lead to this procedure are derived in large part from the available data. Always take into account that the treatment will also be dependent on availability of resources and existing treatment guidelines, considered by other Pain Management Practitioners as being common knowledge and practice, at the time of the intervention. For Medico-Legal purposes, it is also important to point out that variation in procedural techniques and pharmacological choices are the acceptable norm. The indications, contraindications, technique, and results of the above procedure should only be interpreted and judged by a Board-Certified Interventional Pain Specialist with extensive familiarity and expertise in the  same exact procedure and technique.

## 2021-05-26 ENCOUNTER — Telehealth: Payer: Self-pay

## 2021-05-26 NOTE — Telephone Encounter (Signed)
Pt was called concerning her procedure on yesterday. No problems was reported.  ?

## 2021-06-02 ENCOUNTER — Other Ambulatory Visit: Payer: Self-pay | Admitting: Gastroenterology

## 2021-06-08 ENCOUNTER — Encounter: Payer: Self-pay | Admitting: Student in an Organized Health Care Education/Training Program

## 2021-06-09 ENCOUNTER — Ambulatory Visit
Payer: Medicare HMO | Attending: Student in an Organized Health Care Education/Training Program | Admitting: Student in an Organized Health Care Education/Training Program

## 2021-06-09 ENCOUNTER — Encounter: Payer: Self-pay | Admitting: Student in an Organized Health Care Education/Training Program

## 2021-06-09 ENCOUNTER — Other Ambulatory Visit: Payer: Self-pay

## 2021-06-09 DIAGNOSIS — M4316 Spondylolisthesis, lumbar region: Secondary | ICD-10-CM

## 2021-06-09 DIAGNOSIS — M47816 Spondylosis without myelopathy or radiculopathy, lumbar region: Secondary | ICD-10-CM | POA: Diagnosis not present

## 2021-06-09 DIAGNOSIS — M4300 Spondylolysis, site unspecified: Secondary | ICD-10-CM | POA: Diagnosis not present

## 2021-06-09 DIAGNOSIS — M4310 Spondylolisthesis, site unspecified: Secondary | ICD-10-CM

## 2021-06-09 DIAGNOSIS — G894 Chronic pain syndrome: Secondary | ICD-10-CM

## 2021-06-09 NOTE — Progress Notes (Signed)
Patient: Tabitha Williams  Service Category: E/M  Provider: [name], MD  DOB: June 06, 1975  DOS: 06/09/2021  Location: Office  MRN: [id number]  Setting: Ambulatory outpatient  Referring Provider: [facility] [place]  Type: Established Patient  Specialty: Interventional Pain Management  PCP: [facility], [place]  Location: Remote location  Delivery: TeleHealth     Virtual Encounter - Pain Management PROVIDER NOTE: Information contained herein reflects review and annotations entered in association with encounter. Interpretation of such information and data should be left to medically-trained personnel. Information provided to patient can be located elsewhere in the medical record under "Patient Instructions". Document created using STT-dictation technology, any transcriptional errors that may result from process are unintentional.    Contact & Pharmacy Preferred: [phone] Home: [phone] (home) Mobile: [phone] (mobile) E-mail: [email or web address] [place] [address] [place] [address] Phone: [phone] Fax: [phone]   Pre-screening  Tabitha Williams offered "in-person" vs "virtual" encounter. She indicated preferring virtual for this encounter.   Reason COVID-19*   Social distancing based on CDC and AMA recommendations.   I contacted Tabitha Williams on 06/09/2021 via telephone.      I clearly identified myself as [name], MD. I verified that I was speaking with the correct person using two identifiers (Name: Tabitha Williams, and date of birth: 08/14/1975).  Consent I sought verbal advanced consent from Tabitha Williams for virtual visit interactions. I informed Tabitha Williams of possible security and privacy concerns, risks, and limitations associated with providing "not-in-person" medical evaluation and management services. I also informed Tabitha Williams of the  availability of "in-person" appointments. Finally, I informed her that there would be a charge for the virtual visit and that she could be  personally, fully or partially, financially responsible for it. Tabitha Williams expressed understanding and agreed to proceed.   Historic Elements   Tabitha Williams is a 46 y.o. year old, female patient evaluated today after our last contact on 05/25/2021. Tabitha Williams  has a past medical history of Bipolar 1 disorder with moderate mania ([place]), Endometriosis, Mental health problem, and Rectal bleeding. She also  has a past surgical history that includes Cholecystectomy; laparoscopic supracervical hysterectomy (2011); Cesarean section; Gallbladder surgery; and Colonoscopy with propofol (N/A, 06/03/2019). Tabitha Williams has a current medication list which includes the following prescription(s): alprazolam, clonidine, clotrimazole, coenzyme q10, deutetrabenazine, flovent hfa, hydrocortisone, victoza, lisinopril-hydrochlorothiazide, lurasidone, magnesium oxide, nortriptyline, quetiapine fumarate, tizanidine, venlafaxine, vitamin d (ergocalciferol), and bac. She  reports that she has been smoking cigarettes. She has been smoking an average of 1 pack per day. She has never used smokeless tobacco. She reports that she does not currently use alcohol. She reports current drug use. Frequency: 7.00 times per week. Drug: Marijuana. Tabitha Williams is allergic to morphine and related, sulfa antibiotics, adhesive [tape], and elemental sulfur.   HPI  Today, she is being contacted for a post-procedure assessment.    Post-procedure evaluation    Type: Lumbar Facet, Medial Branch Block(s) #1  Laterality: Bilateral  Level: L3, L4, L5, Medial Branch Level(s). Injecting these levels blocks the L3-4 and L5-S1 lumbar facet joints. Imaging: Fluoroscopic guidance Anesthesia: Local anesthesia (1-2% Lidocaine) Anxiolysis: None                 Sedation: None. DOS:  05/25/2021 Performed by: [name], MD  Primary Purpose: Diagnostic/Therapeutic Indications: Low back pain severe enough to impact quality of life or  function. 1. Lumbar facet arthropathy   2. Lumbar spondylosis   3. Spondylolisthesis at L4-L5 level   4. Chronic pain syndrome    NAS-11 Pain score:   Pre-procedure: 4 /10   Post-procedure: 5 /10      Effectiveness:  Initial hour after procedure: 50 %  Subsequent 4-6 hours post-procedure: 50 %  Analgesia past initial 6 hours: 80 %  Ongoing improvement:  Analgesic:  75-80% Function: Somewhat improved ROM: Somewhat improved  Repeat block #2, then proceed with RFA  Laboratory Chemistry Profile   Renal Lab Results  Component Value Date   BUN 14 05/20/2019   BUN 14 05/20/2019   CREATININE 1.48 (H) 05/20/2019   CREATININE 1.48 (H) 05/20/2019   BCR 9 05/20/2019   BCR 9 05/20/2019   GFRAA 50 (L) 05/20/2019   GFRAA 50 (L) 05/20/2019   GFRNONAA 43 (L) 05/20/2019   GFRNONAA 43 (L) 05/20/2019    Hepatic Lab Results  Component Value Date   AST 12 01/23/2020   ALT 12 01/23/2020   ALBUMIN 4.6 01/23/2020   ALKPHOS 83 01/23/2020    Electrolytes Lab Results  Component Value Date   NA 140 05/20/2019   NA 140 05/20/2019   K 4.5 05/20/2019   K 4.5 05/20/2019   CL 102 05/20/2019   CL 102 05/20/2019   CALCIUM 10.1 05/20/2019   CALCIUM 10.1 05/20/2019    Bone Lab Results  Component Value Date   VD125OH2TOT 36 05/20/2019   [id number] 32 05/20/2019   [id number] <10 05/20/2019    Inflammation (CRP: Acute Phase) (ESR: Chronic Phase) Lab Results  Component Value Date   CRP 3 05/20/2019         Note: Above Lab results reviewed.   Assessment  The primary encounter diagnosis was Lumbar facet arthropathy. Diagnoses of Lumbar spondylosis, Spondylolisthesis at L4-L5 level, Pars defect with spondylolisthesis, and Chronic pain syndrome were also pertinent to this visit.  Plan of Care  Problem-specific:  Lumbar  spondylosis Status post positive first diagnostic medial branch nerve block as detailed above.  Plan for block #2 and then subsequently radiofrequency ablation.  Orders Placed This Encounter  Procedures   LUMBAR FACET(MEDIAL BRANCH NERVE BLOCK) MBNB    Standing Status:   Future    Standing Expiration Date:   07/07/2021    Scheduling Instructions:     Procedure: Lumbar facet block (AKA.: Lumbosacral medial branch nerve block)     Side: Bilateral     Level: L3-4, L5-S1 Facets (L3, L4, L5, Medial Branch Nerves)     Sedation: IV Versed     Timeframe: ASAA    Order Specific Question:   Where will this procedure be performed?    Answer:   [facility]    Tabitha Williams has a current medication list which includes the following long-term medication(s): clonidine, flovent hfa, victoza, lisinopril-hydrochlorothiazide, lurasidone, nortriptyline, quetiapine fumarate, and venlafaxine.    Orders:  Orders Placed This Encounter  Procedures   LUMBAR FACET(MEDIAL BRANCH NERVE BLOCK) MBNB    Standing Status:   Future    Standing Expiration Date:   07/07/2021    Scheduling Instructions:     Procedure: Lumbar facet block (AKA.: Lumbosacral medial branch nerve block)     Side: Bilateral     Level: L3-4, L5-S1 Facets (L3, L4, L5, Medial Branch Nerves)     Sedation: IV Versed     Timeframe: ASAA    Order Specific Question:   Where will this procedure be performed?  Answer:   [facility]   Follow-up plan:   Return in about 2 weeks (around 06/23/2021) for B/L L3, 4, 5 MBNB #2, in clinic IV Versed.     B/L L3,4,5 Fct block #1    Recent Visits Date Type Provider Dept  05/25/21 Procedure visit [name], MD [facility]  05/17/21 Office Visit [name], MD [facility]  Showing recent visits within past 90 days and meeting all other requirements Today's Visits Date Type Provider Dept  06/09/21 Office Visit [name], MD [facility]   Showing today's visits and meeting all other requirements Future Appointments No visits were found meeting these conditions. Showing future appointments within next 90 days and meeting all other requirements  I discussed the assessment and treatment plan with the patient. The patient was provided an opportunity to ask questions and all were answered. The patient agreed with the plan and demonstrated an understanding of the instructions.  Patient advised to call back or seek an in-person evaluation if the symptoms or condition worsens.  Duration of encounter: 73minutes.  Note by: [name], MD Date: 06/09/2021; Time: 1:38 PM

## 2021-06-09 NOTE — Assessment & Plan Note (Signed)
Status post positive first diagnostic medial branch nerve block as detailed above.  Plan for block #2 and then subsequently radiofrequency ablation.  Orders Placed This Encounter  Procedures   LUMBAR FACET(MEDIAL BRANCH NERVE BLOCK) MBNB    Standing Status:   Future    Standing Expiration Date:   07/07/2021    Scheduling Instructions:     Procedure: Lumbar facet block (AKA.: Lumbosacral medial branch nerve block)     Side: Bilateral     Level: L3-4, L5-S1 Facets (L3, L4, L5, Medial Branch Nerves)     Sedation: IV Versed     Timeframe: ASAA    Order Specific Question:   Where will this procedure be performed?    Answer:   [facility]

## 2021-06-22 ENCOUNTER — Ambulatory Visit (HOSPITAL_BASED_OUTPATIENT_CLINIC_OR_DEPARTMENT_OTHER): Payer: Medicare HMO | Admitting: Student in an Organized Health Care Education/Training Program

## 2021-06-22 ENCOUNTER — Ambulatory Visit
Admission: RE | Admit: 2021-06-22 | Discharge: 2021-06-22 | Disposition: A | Discharge status: Home / Self Care | Payer: Medicare HMO | Source: Ambulatory Visit | Attending: Student in an Organized Health Care Education/Training Program | Admitting: Student in an Organized Health Care Education/Training Program

## 2021-06-22 ENCOUNTER — Encounter: Payer: Self-pay | Admitting: Student in an Organized Health Care Education/Training Program

## 2021-06-22 ENCOUNTER — Other Ambulatory Visit: Payer: Self-pay

## 2021-06-22 DIAGNOSIS — G894 Chronic pain syndrome: Secondary | ICD-10-CM | POA: Insufficient documentation

## 2021-06-22 DIAGNOSIS — M47816 Spondylosis without myelopathy or radiculopathy, lumbar region: Secondary | ICD-10-CM | POA: Diagnosis present

## 2021-06-22 DIAGNOSIS — M4316 Spondylolisthesis, lumbar region: Secondary | ICD-10-CM | POA: Diagnosis present

## 2021-06-22 MED ORDER — MIDAZOLAM HCL 5 MG/5ML IJ SOLN
0.5000 mg | Freq: Once | INTRAMUSCULAR | Status: AC
  Administered 2021-06-22: 2 mg via INTRAVENOUS

## 2021-06-22 MED ORDER — ROPIVACAINE HCL 2 MG/ML IJ SOLN
9.0000 mL | Freq: Once | INTRAMUSCULAR | Status: AC
  Administered 2021-06-22: 20 mL via PERINEURAL

## 2021-06-22 MED ORDER — DEXAMETHASONE SODIUM PHOSPHATE 10 MG/ML IJ SOLN
10.0000 mg | Freq: Once | INTRAMUSCULAR | Status: AC
  Administered 2021-06-22: 10 mg

## 2021-06-22 MED ORDER — LIDOCAINE HCL 2 % IJ SOLN
INTRAMUSCULAR | Status: AC
  Filled 2021-06-22: qty 20

## 2021-06-22 MED ORDER — LIDOCAINE HCL 2 % IJ SOLN
20.0000 mL | Freq: Once | INTRAMUSCULAR | Status: AC
  Administered 2021-06-22: 400 mg

## 2021-06-22 MED ORDER — MIDAZOLAM HCL 5 MG/5ML IJ SOLN
INTRAMUSCULAR | Status: AC
  Filled 2021-06-22: qty 5

## 2021-06-22 MED ORDER — DEXAMETHASONE SODIUM PHOSPHATE 10 MG/ML IJ SOLN
10.0000 mg | Freq: Once | INTRAMUSCULAR | Status: AC
  Administered 2021-06-22: 10 mg
  Filled 2021-06-22: qty 1

## 2021-06-22 MED ORDER — ROPIVACAINE HCL 2 MG/ML IJ SOLN
INTRAMUSCULAR | Status: AC
  Filled 2021-06-22: qty 20

## 2021-06-22 MED ORDER — DEXAMETHASONE SODIUM PHOSPHATE 10 MG/ML IJ SOLN
INTRAMUSCULAR | Status: AC
  Filled 2021-06-22: qty 1

## 2021-06-22 NOTE — Progress Notes (Signed)
PROVIDER NOTE: Interpretation of information contained herein should be left to medically-trained personnel. Specific patient instructions are provided elsewhere under "Patient Instructions" section of medical record. This document was created in part using STT-dictation technology, any transcriptional errors that may result from this process are unintentional.  Patient: Tabitha Williams Type: Established DOB: 17-Dec-1975 MRN: [id number] [facility]CP: [facility]  Service: Procedure DOS: 06/22/2021 Setting: Ambulatory Location: Ambulatory outpatient facility Delivery: Face-to-face Provider: Edward Jolly, MD Specialty: Interventional Pain Management Specialty designation: 09 Location: Outpatient facility Ref. Prov.: Edward Jolly, MD    Primary Reason for Visit: Interventional Pain Management Treatment. CC: Back Pain   Procedure:           Type: Lumbar Facet, Medial Branch Block(s) #2  Laterality: Bilateral  Level: L3, L4, L5, Medial Branch Level(s). Injecting these levels blocks the L3-4 and L5-S1 lumbar facet joints. Imaging: Fluoroscopic guidance Anesthesia: Local anesthesia (1-2% Lidocaine) Anxiolysis: IV Versed 2.0 mg DOS: 06/22/2021 Performed by: Edward Jolly, MD  Primary Purpose: Diagnostic/Therapeutic Indications: Low back pain severe enough to impact quality of life or function. 1. Lumbar facet arthropathy   2. Lumbar spondylosis   3. Spondylolisthesis at L4-L5 level   4. Chronic pain syndrome    NAS-11 Pain score:   Pre-procedure: 2 /10   Post-procedure: 2 /10     Position / Prep / Materials:  Position: Prone  Prep solution: DuraPrep (Iodine Povacrylex [0.7% available iodine] and Isopropyl Alcohol, 74% w/w) Area Prepped: Posterolateral Lumbosacral Spine (Wide prep: From the lower border of the scapula down to the end of the tailbone and from flank to flank.)  Materials:  Tray: Block Needle(s):  Type: Spinal  Gauge (G): 22  Length: 5-in Qty:  3  Pre-op H&P Assessment:  Tabitha Williams is a 46 y.o. (year old), female patient, seen today for interventional treatment. She  has a past surgical history that includes Cholecystectomy; laparoscopic supracervical hysterectomy (2011); Cesarean section; Gallbladder surgery; and Colonoscopy with propofol (N/A, 06/03/2019). Tabitha Williams has a current medication list which includes the following prescription(s): alprazolam, clonidine, clotrimazole, coenzyme q10, deutetrabenazine, flovent hfa, hydrocortisone, victoza, lisinopril-hydrochlorothiazide, lurasidone, magnesium oxide, nortriptyline, quetiapine fumarate, venlafaxine, vitamin d (ergocalciferol), and bac. Her primarily concern today is the Back Pain  Initial Vital Signs:  Pulse/HCG Rate:  (!) 109 ECG Heart Rate: 96 Temp: (!) 97.1 F (36.2 C) Resp: 16 BP: 105/85 SpO2: 98 %  BMI: Estimated body mass index is 44.26 kg/m as calculated from the following:   Height as of this encounter: 5\' 5"  (1.651 m).   Weight as of this encounter: 266 lb (120.7 kg).  Risk Assessment: Allergies: Reviewed. She is allergic to morphine and related, sulfa antibiotics, adhesive [tape], and elemental sulfur.  Allergy Precautions: None required Coagulopathies: Reviewed. None identified.  Blood-thinner therapy: None at this time Active Infection(s): Reviewed. None identified. Tabitha Williams is afebrile  Site Confirmation: Tabitha Williams was asked to confirm the procedure and laterality before marking the site Procedure checklist: Completed Consent: Before the procedure and under the influence of no sedative(s), amnesic(s), or anxiolytics, the patient was informed of the treatment options, risks and possible complications. To fulfill our ethical and legal obligations, as recommended by the American Medical Association's Code of Ethics, I have informed the patient of my clinical impression; the nature and purpose of the treatment or procedure; the risks,  benefits, and possible complications of the intervention; the alternatives, including doing nothing; the risk(s) and benefit(s) of the alternative treatment(s) or procedure(s); and the risk(s)  and benefit(s) of doing nothing. The patient was provided information about the general risks and possible complications associated with the procedure. These may include, but are not limited to: failure to achieve desired goals, infection, bleeding, organ or nerve damage, allergic reactions, paralysis, and death. In addition, the patient was informed of those risks and complications associated to Spine-related procedures, such as failure to decrease pain; infection (i.e.: Meningitis, epidural or intraspinal abscess); bleeding (i.e.: epidural hematoma, subarachnoid hemorrhage, or any other type of intraspinal or peri-dural bleeding); organ or nerve damage (i.e.: Any type of peripheral nerve, nerve root, or spinal cord injury) with subsequent damage to sensory, motor, and/or autonomic systems, resulting in permanent pain, numbness, and/or weakness of one or several areas of the body; allergic reactions; (i.e.: anaphylactic reaction); and/or death. Furthermore, the patient was informed of those risks and complications associated with the medications. These include, but are not limited to: allergic reactions (i.e.: anaphylactic or anaphylactoid reaction(s)); adrenal axis suppression; blood sugar elevation that in diabetics may result in ketoacidosis or comma; water retention that in patients with history of congestive heart failure may result in shortness of breath, pulmonary edema, and decompensation with resultant heart failure; weight gain; swelling or edema; medication-induced neural toxicity; particulate matter embolism and blood vessel occlusion with resultant organ, and/or nervous system infarction; and/or aseptic necrosis of one or more joints. Finally, the patient was informed that Medicine is not an exact science;  therefore, there is also the possibility of unforeseen or unpredictable risks and/or possible complications that may result in a catastrophic outcome. The patient indicated having understood very clearly. We have given the patient no guarantees and we have made no promises. Enough time was given to the patient to ask questions, all of which were answered to the patient's satisfaction. [name] has indicated that she wanted to continue with the procedure. Attestation: I, the ordering provider, attest that I have discussed with the patient the benefits, risks, side-effects, alternatives, likelihood of achieving goals, and potential problems during recovery for the procedure that I have provided informed consent. Date   Time: 06/22/2021  8:58 AM  Pre-Procedure Preparation:  Monitoring: As per clinic protocol. Respiration, ETCO2, SpO2, BP, heart rate and rhythm monitor placed and checked for adequate function Safety Precautions: Patient was assessed for positional comfort and pressure points before starting the procedure. Time-out: I initiated and conducted the "Time-out" before starting the procedure, as per protocol. The patient was asked to participate by confirming the accuracy of the "Time Out" information. Verification of the correct person, site, and procedure were performed and confirmed by me, the nursing staff, and the patient. "Time-out" conducted as per Joint Commission's Universal Protocol (UP.01.01.01). Time: 0929  Description of Procedure:          Laterality: Bilateral. The procedure was performed in identical fashion on both sides. Levels:  L3, L4, L5,  Medial Branch Level(s)  Safety Precautions: Aspiration looking for blood return was conducted prior to all injections. At no point did we inject any substances, as a needle was being advanced. Before injecting, the patient was told to immediately notify me if she was experiencing any new onset of "ringing in the ears, or metallic  taste in the mouth". No attempts were made at seeking any paresthesias. Safe injection practices and needle disposal techniques used. Medications properly checked for expiration dates. SDV (single dose vial) medications used. After the completion of the procedure, all disposable equipment used was discarded in the proper designated medical waste containers. Local  Anesthesia: Protocol guidelines were followed. The patient was positioned over the fluoroscopy table. The area was prepped in the usual manner. The time-out was completed. The target area was identified using fluoroscopy. A 12-in long, straight, sterile hemostat was used with fluoroscopic guidance to locate the targets for each level blocked. Once located, the skin was marked with an approved surgical skin marker. Once all sites were marked, the skin (epidermis, dermis, and hypodermis), as well as deeper tissues (fat, connective tissue and muscle) were infiltrated with a small amount of a short-acting local anesthetic, loaded on a 10cc syringe with a 25G, 1.5-in  Needle. An appropriate amount of time was allowed for local anesthetics to take effect before proceeding to the next step. Local Anesthetic: Lidocaine 2.0% The unused portion of the local anesthetic was discarded in the proper designated containers. Technical explanation of process:  L3 Medial Branch Nerve Block (MBB): The target area for the L3 medial branch is at the junction of the postero-lateral aspect of the superior articular process and the superior, posterior, and medial edge of the transverse process of L4. Under fluoroscopic guidance, a Quincke needle was inserted until contact was made with os over the superior postero-lateral aspect of the pedicular shadow (target area). After negative aspiration for blood, 9mL of the nerve block solution was injected without difficulty or complication. The needle was removed intact. L4 Medial Branch Nerve Block (MBB): The target area for the L4  medial branch is at the junction of the postero-lateral aspect of the superior articular process and the superior, posterior, and medial edge of the transverse process of L5. Under fluoroscopic guidance, a Quincke needle was inserted until contact was made with os over the superior postero-lateral aspect of the pedicular shadow (target area). After negative aspiration for blood, 53mL of the nerve block solution was injected without difficulty or complication. The needle was removed intact. L5 Medial Branch Nerve Block (MBB): The target area for the L5 medial branch is at the junction of the postero-lateral aspect of the superior articular process and the superior, posterior, and medial edge of the sacral ala. Under fluoroscopic guidance, a Quincke needle was inserted until contact was made with os over the superior postero-lateral aspect of the pedicular shadow (target area). After negative aspiration for blood, 65mL of the nerve block solution was injected without difficulty or complication. The needle was removed intact. Once the entire procedure was completed, the treated area was cleaned, making sure to leave some of the prepping solution back to take advantage of its long term bactericidal properties.  12 cc solution made of 10 cc of 0.2% ropivacaine, 2 cc of Decadron 10 mg/cc. 2 cc injected at each level above        Illustration of the posterior view of the lumbar spine and the posterior neural structures. Laminae of L2 through S1 are labeled. DPRL5, dorsal primary ramus of L5; DPRS1, dorsal primary ramus of S1; DPR3, dorsal primary ramus of L3; FJ, facet (zygapophyseal) joint L3-L4; I, inferior articular process of L4; LB1, lateral branch of dorsal primary ramus of L1; IAB, inferior articular branches from L3 medial branch (supplies L4-L5 facet joint); IBP, intermediate branch plexus; MB3, medial branch of dorsal primary ramus of L3; NR3, third lumbar nerve root; S, superior articular process of  L5; SAB, superior articular branches from L4 (supplies L4-5 facet joint also); TP3, transverse process of L3.  Vitals:   06/22/21 0930 06/22/21 0935 06/22/21 0940 06/22/21 0951  BP: 110/85 113/88 113/86 101/76  Pulse:      Resp: 20 18 20 18   Temp:    (!) 97.3 F (36.3 C)  SpO2: 100% 100% 98% 95%  Weight:      Height:         Start Time: 0929 hrs. End Time: 0941 hrs.  Imaging Guidance (Spinal):          Type of Imaging Technique: Fluoroscopy Guidance (Spinal) Indication(s): Assistance in needle guidance and placement for procedures requiring needle placement in or near specific anatomical locations not easily accessible without such assistance. Exposure Time: Please see nurses notes. Contrast: None used. Fluoroscopic Guidance: I was personally present during the use of fluoroscopy. "Tunnel Vision Technique" used to obtain the best possible view of the target area. Parallax error corrected before commencing the procedure. "Direction-depth-direction" technique used to introduce the needle under continuous pulsed fluoroscopy. Once target was reached, antero-posterior, oblique, and lateral fluoroscopic projection used confirm needle placement in all planes. Images permanently stored in EMR. Interpretation: No contrast injected. I personally interpreted the imaging intraoperatively. Adequate needle placement confirmed in multiple planes. Permanent images saved into the patient's record.  Post-operative Assessment:  Post-procedure Vital Signs:  Pulse/HCG Rate:  (!) 109 85 Temp: (!) 97.3 F (36.3 C) Resp:  18 BP:  101/76 SpO2: 95 %  EBL: None  Complications: No immediate post-treatment complications observed by team, or reported by patient.  Note: The patient tolerated the entire procedure well. A repeat set of vitals were taken after the procedure and the patient was kept under observation following institutional policy, for this type of procedure. Post-procedural neurological  assessment was performed, showing return to baseline, prior to discharge. The patient was provided with post-procedure discharge instructions, including a section on how to identify potential problems. Should any problems arise concerning this procedure, the patient was given instructions to immediately contact us, at any time, without hesitation. In any case, we plan to contact the patient by telephone for a follow-up status report regarding this interventional procedure.  Comments:  No additional relevant information.  Plan of Care  Orders:  Orders Placed This Encounter  Procedures   DG PAIN CL[facility] C-ARM 1-60 MIN NO REPORT    Intraoperative interpretation by procedural physician at [facility].    Standing Status:   Standing    Number of Occurrences:   1    Order Specific Question:   Reason for exam:    Answer:   Assistance in needle guidance and placement for procedures requiring needle placement in or near specific anatomical locations not easily accessible without such assistance.    Medications ordered for procedure: Meds ordered this encounter  Medications   lidocaine (XYLOCAINE) 2 % (with pres) injection 400 mg   midazolam (VERSED) 5 MG/5ML injection 0.5-2 mg    Make sure Flumazenil is available in the pyxis when using this medication. If oversedation occurs, administer 0.2 mg IV over 15 sec. If after 45 sec no response, administer 0.2 mg again over 1 min; may repeat at 1 min intervals; not to exceed 4 doses (1 mg)   dexamethasone (DECADRON) injection 10 mg   dexamethasone (DECADRON) injection 10 mg   ropivacaine (PF) 2 mg/mL (0.2%) (NAROPIN) injection 9 mL   ropivacaine (PF) 2 mg/mL (0.2%) (NAROPIN) injection 9 mL   Medications administered: We administered lidocaine, midazolam, dexamethasone, dexamethasone, ropivacaine (PF) 2 mg/mL (0.2%), and ropivacaine (PF) 2 mg/mL (0.2%).  See the medical record for exact dosing, route, and time of administration.  Follow-up  plan:  Return in about 4 weeks (around 07/20/2021) for Post Procedure Evaluation, virtual.       B/L L3,4,5 Fct block #1: 05/25/21; #2 06/22/21    Recent Visits Date Type Provider Dept  06/09/21 Office Visit Edward JollyLateef, Deangleo Passage, MD [facility]  05/25/21 Procedure visit Edward JollyLateef, Bingham Millette, MD [facility]  05/17/21 Office Visit Edward JollyLateef, Bedelia Pong, MD [facility]  Showing recent visits within past 90 days and meeting all other requirements Today's Visits Date Type Provider Dept  06/22/21 Procedure visit Edward JollyLateef, Javione Gunawan, MD [facility]  Showing today's visits and meeting all other requirements Future Appointments Date Type Provider Dept  07/20/21 Appointment Edward JollyLateef, Cylus Douville, MD [facility]  Showing future appointments within next 90 days and meeting all other requirements  Disposition: Discharge home  Discharge (Date   Time): 06/22/2021; 0957 hrs.   Primary Care Physician: [facility] Note by: Edward JollyBilal Tawan Corkern, MD Date: 06/22/2021; Time: 10:10 AM  Disclaimer:  Medicine is not an Visual merchandiserexact science. The only guarantee in medicine is that nothing is guaranteed. It is important to note that the decision to proceed with this intervention was based on the information collected from the patient. The Data and conclusions were drawn from the patient's questionnaire, the interview, and the physical examination. Because the information was provided in large part by the patient, it cannot be guaranteed that it has not been purposely or unconsciously manipulated. Every effort has been made to obtain as much relevant data as possible for this evaluation. It is important to note that the conclusions that lead to this procedure are derived in large part from the available data. Always take into account that the treatment will also be dependent on availability of resources and existing treatment guidelines,  considered by other Pain Management Practitioners as being common knowledge and practice, at the time of the intervention. For Medico-Legal purposes, it is also important to point out that variation in procedural techniques and pharmacological choices are the acceptable norm. The indications, contraindications, technique, and results of the above procedure should only be interpreted and judged by a Board-Certified Interventional Pain Specialist with extensive familiarity and expertise in the same exact procedure and technique.

## 2021-06-22 NOTE — Progress Notes (Signed)
Safety precautions to be maintained throughout the outpatient stay will include: orient to surroundings, keep bed in low position, maintain call bell within reach at all times, provide assistance with transfer out of bed and ambulation.  

## 2021-06-22 NOTE — Patient Instructions (Signed)

## 2021-06-23 ENCOUNTER — Other Ambulatory Visit
Admission: RE | Admit: 2021-06-23 | Discharge: 2021-06-23 | Disposition: A | Discharge status: Home / Self Care | Payer: Medicare HMO | Source: Ambulatory Visit | Attending: Otolaryngology | Admitting: Otolaryngology

## 2021-06-23 DIAGNOSIS — Z20822 Contact with and (suspected) exposure to covid-19: Secondary | ICD-10-CM | POA: Diagnosis not present

## 2021-06-23 DIAGNOSIS — Z01812 Encounter for preprocedural laboratory examination: Secondary | ICD-10-CM | POA: Insufficient documentation

## 2021-06-23 NOTE — Telephone Encounter (Signed)
Called pp states she is feeling wonderful today. Encouraged heat today. Instructed to call back if needed.

## 2021-06-24 ENCOUNTER — Ambulatory Visit: Payer: Medicare HMO | Attending: Neurology

## 2021-06-24 DIAGNOSIS — G4733 Obstructive sleep apnea (adult) (pediatric): Secondary | ICD-10-CM | POA: Insufficient documentation

## 2021-06-24 LAB — SARS CORONAVIRUS 2 (TAT 6-24 HRS): SARS Coronavirus 2: NEGATIVE

## 2021-06-29 ENCOUNTER — Other Ambulatory Visit: Payer: Self-pay

## 2021-07-20 ENCOUNTER — Encounter: Payer: Self-pay | Admitting: Student in an Organized Health Care Education/Training Program

## 2021-07-20 ENCOUNTER — Other Ambulatory Visit: Payer: Self-pay

## 2021-07-20 ENCOUNTER — Ambulatory Visit
Payer: Medicare HMO | Attending: Student in an Organized Health Care Education/Training Program | Admitting: Student in an Organized Health Care Education/Training Program

## 2021-07-20 DIAGNOSIS — M47816 Spondylosis without myelopathy or radiculopathy, lumbar region: Secondary | ICD-10-CM

## 2021-07-20 DIAGNOSIS — G894 Chronic pain syndrome: Secondary | ICD-10-CM

## 2021-07-20 DIAGNOSIS — M4316 Spondylolisthesis, lumbar region: Secondary | ICD-10-CM | POA: Diagnosis not present

## 2021-07-20 NOTE — Progress Notes (Signed)
Patient: Tabitha Williams  Service Category: E/M  Provider: [name], MD  ?DOB: 08-Dec-1975  DOS: 07/20/2021  Location: Office  ?MRN: [id number]  Setting: Ambulatory outpatient  Referring Provider: Center, [place]: Established Patient  Specialty: Interventional Pain Management  PCP: [facility]  ?Location: Remote location  Delivery: TeleHealth    ? ?Virtual Encounter - Pain Management ?PROVIDER NOTE: Information contained herein reflects review and annotations entered in association with encounter. Interpretation of such information and data should be left to medically-trained personnel. Information provided to patient can be located elsewhere in the medical record under "Patient Instructions". Document created using STT-dictation technology, any transcriptional errors that may result from process are unintentional.  ?  ?Contact & Pharmacy ?Preferred: [phone] ?Home: [phone] (home) ?Mobile: [phone] (mobile) ?E-mail: [email or web address]  ?[place] ?[place] [address] ?Phone: [phone] Fax: [phone] ?  ?Pre-screening  ?Tabitha Williams offered "in-person" vs "virtual" encounter. She indicated preferring virtual for this encounter.  ? ?Reason ?COVID-19*  Social distancing based on CDC and AMA recommendations.  ? ?I contacted BELANNA MANRING on 07/20/2021 via telephone.      I clearly identified myself as [name], MD. I verified that I was speaking with the correct person using two identifiers (Name: Tabitha Williams, and date of birth: 1975-05-07). ? ?Consent ?I sought verbal advanced consent from [name] for virtual visit interactions. I informed Ms. Brittle of possible security and privacy concerns, risks, and limitations associated with providing "not-in-person" medical evaluation and management services. I also informed Ms. Crispo of the  availability of "in-person" appointments. Finally, I informed her that there would be a charge for the virtual visit and that she could be  personally, fully or partially, financially responsible for it. [name] expressed understanding and agreed to proceed.  ? ?Historic Elements   ?Ms. AMERA Williams is a 46 y.o. year old, female patient evaluated today after our last contact on 06/22/2021. Tabitha Williams  has a past medical history of Bipolar 1 disorder with moderate mania ([place]), Endometriosis, Mental health problem, and Rectal bleeding. She also  has a past surgical history that includes Cholecystectomy; laparoscopic supracervical hysterectomy (2011); Cesarean section; Gallbladder surgery; and Colonoscopy with propofol (N/A, 06/03/2019). [name] has a current medication list which includes the following prescription(s): alprazolam, clonidine, clotrimazole, coenzyme q10, deutetrabenazine, flovent hfa, hydrocortisone, victoza, lisinopril-hydrochlorothiazide, lurasidone, magnesium oxide, nortriptyline, quetiapine fumarate, venlafaxine, vitamin d (ergocalciferol), and bac. She  reports that she has been smoking cigarettes. She has been smoking an average of 1 pack per day. She has never used smokeless tobacco. She reports that she does not currently use alcohol. She reports current drug use. Frequency: 7.00 times per week. Drug: Marijuana. Tabitha Williams is allergic to morphine and related, sulfa antibiotics, adhesive [tape], and elemental sulfur.  ? ?HPI  ?Today, she is being contacted for a post-procedure assessment. ? ? ?Post-procedure evaluation  ?  Type: Lumbar Facet, Medial Branch Block(s) #2  ?Laterality: Bilateral  ?Level: L3, L4, L5, Medial Branch Level(s). Injecting these levels blocks the L3-4 and L5-S1 lumbar facet joints. ?Imaging: Fluoroscopic guidance ?Anesthesia: Local anesthesia (1-2% Lidocaine) ?Anxiolysis: IV Versed 2.0 mg ?DOS: 06/22/2021 ?Performed by: [name],  MD ? ?Primary Purpose: Diagnostic/Therapeutic ?Indications: Low back pain severe enough to impact quality of life or function. ?1. Lumbar facet arthropathy   ?2. Lumbar spondylosis   ?3. Spondylolisthesis at L4-L5 level   ?  4. Chronic pain syndrome   ? ?NAS-11 Pain score:  ? Pre-procedure: 2 /10  ? Post-procedure: 2 /10  ? ?   ?Effectiveness:  ?Initial hour after procedure: 100 %  ?Subsequent 4-6 hours post-procedure: 100 %  ?Analgesia past initial 6 hours: 75 % (ongoing)  ?Ongoing improvement:  ?Analgesic:  75-80% for 10-14 days, then gradual return. ?Function: Ms. Malkiewicz reports improvement in function ?ROM: [name] reports improvement in ROM ? ? ?Laboratory Chemistry Profile  ? ?Renal ?Lab Results  ?Component Value Date  ? BUN 14 05/20/2019  ? BUN 14 05/20/2019  ? CREATININE 1.48 (H) 05/20/2019  ? CREATININE 1.48 (H) 05/20/2019  ? BCR 9 05/20/2019  ? BCR 9 05/20/2019  ? GFRAA 50 (L) 05/20/2019  ? GFRAA 50 (L) 05/20/2019  ? GFRNONAA 43 (L) 05/20/2019  ? GFRNONAA 43 (L) 05/20/2019  ?  Hepatic ?Lab Results  ?Component Value Date  ? AST 12 01/23/2020  ? ALT 12 01/23/2020  ? ALBUMIN 4.6 01/23/2020  ? ALKPHOS 83 01/23/2020  ?  ?Electrolytes ?Lab Results  ?Component Value Date  ? NA 140 05/20/2019  ? NA 140 05/20/2019  ? K 4.5 05/20/2019  ? K 4.5 05/20/2019  ? CL 102 05/20/2019  ? CL 102 05/20/2019  ? CALCIUM 10.1 05/20/2019  ? CALCIUM 10.1 05/20/2019  ?  Bone ?Lab Results  ?Component Value Date  ? [id number] 36 05/20/2019  ? [id number] 29 05/20/2019  ? [id number] <10 05/20/2019  ?  ?Inflammation (CRP: Acute Phase) (ESR: Chronic Phase) ?Lab Results  ?Component Value Date  ? CRP 3 05/20/2019  ?    ?  ? ?Note: Above Lab results reviewed. ? ?Imaging  ?SLEEP STUDY DOCUMENTS ?Ordered by an unspecified provider. ? ?Assessment  ?The primary encounter diagnosis was Lumbar facet arthropathy. Diagnoses of Lumbar spondylosis, Spondylolisthesis at L4-L5 level, and Chronic pain syndrome were also pertinent to this  visit. ? ?Plan of Care  ? ? ?Patient is status post her second set of diagnostic lumbar facet medial branch nerve blocks bilaterally at L3, L4, L5.  She endorses approximately 80% pain relief for the first 10 to 14 days and significant improvement in her functional status during that time as well.  She states that she was able to the grocery store as well as church and significantly less pain.  Given that her pain is returning, patient would like to move forward with radiofrequency ablation procedure which we have discussed in the past.  We have discussed that this could be helpful for providing longer lasting pain relief given the pain relief that she has experienced from her diagnostic medial branch nerve blocks.  We will start with the left side first followed 2 weeks later by the right side.  We will plan to do this with moderate sedation in the ECT suite. ? ?Orders:  ?Orders Placed This Encounter  ?Procedures  ? Radiofrequency,Lumbar  ?  Standing Status:   Future  ?  Standing Expiration Date:   01/20/2022  ?  Scheduling Instructions:  ?   Side(s): LEFT  ?   Level(s):  L3, L4, L5, Medial Branch Nerve(s)  ?   Sedation: With Sedation  ?   Scheduling Timeframe: As soon as pre-approved  ?  Order Specific Question:   Where will this procedure be performed?  ?  Answer:   [facility]  ? Radiofrequency,Lumbar  ?  Standing Status:   Future  ?  Standing Expiration Date:   01/20/2022  ?  Scheduling Instructions:  ?  Side(s):RIGHT  ?   Level(s):  L3, L4, L5, Medial Branch Nerve(s)  ?   Sedation: With Sedation  ?   Scheduling Timeframe: As soon as pre-approved  ?  Order Specific Question:   Where will this procedure be performed?  ?  Answer:   [facility]  ? ?Follow-up plan:   ?Return in about 1 week (around 07/27/2021) for Left L3, 4, 5 RFA , ECT.   ?  ?B/L L3,4,5 Fct block #1: 05/25/21; #2 06/22/21   ?  ?Recent Visits ?Date Type Provider Dept  ?06/22/21 Procedure visit [name], MD [facility]   ?06/09/21 Office Visit [name], MD [facility]  ?05/25/21 Procedure visit [name], MD [facility]  ?05/17/21 Office Visit [name], MD [facility]  ?Showing recent visits

## 2021-08-01 ENCOUNTER — Ambulatory Visit
Admission: RE | Admit: 2021-08-01 | Discharge: 2021-08-01 | Disposition: A | Discharge status: Home / Self Care | Payer: Medicare HMO | Source: Ambulatory Visit | Attending: Student in an Organized Health Care Education/Training Program | Admitting: Student in an Organized Health Care Education/Training Program

## 2021-08-01 ENCOUNTER — Encounter: Payer: Self-pay | Admitting: Student in an Organized Health Care Education/Training Program

## 2021-08-01 ENCOUNTER — Ambulatory Visit (HOSPITAL_BASED_OUTPATIENT_CLINIC_OR_DEPARTMENT_OTHER): Payer: Medicare HMO | Admitting: Student in an Organized Health Care Education/Training Program

## 2021-08-01 VITALS — BP 108/80 | HR 98 | Temp 97.1°F | Resp 18 | Ht 64.5 in | Wt 260.0 lb

## 2021-08-01 DIAGNOSIS — G894 Chronic pain syndrome: Secondary | ICD-10-CM

## 2021-08-01 DIAGNOSIS — M47816 Spondylosis without myelopathy or radiculopathy, lumbar region: Secondary | ICD-10-CM | POA: Diagnosis present

## 2021-08-01 MED ORDER — FENTANYL CITRATE (PF) 100 MCG/2ML IJ SOLN
25.0000 ug | INTRAMUSCULAR | Status: DC | PRN
  Administered 2021-08-01: 100 ug via INTRAVENOUS

## 2021-08-01 MED ORDER — ROPIVACAINE HCL 2 MG/ML IJ SOLN
INTRAMUSCULAR | Status: AC
  Filled 2021-08-01: qty 20

## 2021-08-01 MED ORDER — LIDOCAINE HCL 2 % IJ SOLN
20.0000 mL | Freq: Once | INTRAMUSCULAR | Status: AC
  Administered 2021-08-01: 100 mg

## 2021-08-01 MED ORDER — DEXAMETHASONE SODIUM PHOSPHATE 10 MG/ML IJ SOLN
INTRAMUSCULAR | Status: AC
  Filled 2021-08-01: qty 1

## 2021-08-01 MED ORDER — ROPIVACAINE HCL 2 MG/ML IJ SOLN
9.0000 mL | Freq: Once | INTRAMUSCULAR | Status: AC
  Administered 2021-08-01: 9 mL via PERINEURAL

## 2021-08-01 MED ORDER — MIDAZOLAM HCL 5 MG/5ML IJ SOLN
0.5000 mg | Freq: Once | INTRAMUSCULAR | Status: AC
  Administered 2021-08-01: 1.5 mg via INTRAVENOUS

## 2021-08-01 MED ORDER — FENTANYL CITRATE (PF) 100 MCG/2ML IJ SOLN
INTRAMUSCULAR | Status: AC
  Filled 2021-08-01: qty 2

## 2021-08-01 MED ORDER — MIDAZOLAM HCL 5 MG/5ML IJ SOLN
INTRAMUSCULAR | Status: AC
  Filled 2021-08-01: qty 5

## 2021-08-01 MED ORDER — LIDOCAINE HCL (PF) 2 % IJ SOLN
INTRAMUSCULAR | Status: AC
  Filled 2021-08-01: qty 15

## 2021-08-01 MED ORDER — LACTATED RINGERS IV SOLN: 1000.0000 mL | Freq: Once | INTRAVENOUS | Status: DC

## 2021-08-01 MED ORDER — DEXAMETHASONE SODIUM PHOSPHATE 10 MG/ML IJ SOLN
10.0000 mg | Freq: Once | INTRAMUSCULAR | Status: AC
  Administered 2021-08-01: 10 mg

## 2021-08-01 NOTE — Progress Notes (Signed)
Safety precautions to be maintained throughout the outpatient stay will include: orient to surroundings, keep bed in low position, maintain call bell within reach at all times, provide assistance with transfer out of bed and ambulation.  

## 2021-08-01 NOTE — Progress Notes (Signed)
PROVIDER NOTE: Interpretation of information contained herein should be left to medically-trained personnel. Specific patient instructions are provided elsewhere under "Patient Instructions" section of medical record. This document was created in part using STT-dictation technology, any transcriptional errors that may result from this process are unintentional.  ?Patient: Tabitha Williams ?Type: Established ?DOB: [birth year] ?MRN: [id number] ?[facility]P: [facility]  Service: Procedure ?DOS: 08/01/2021 ?Setting: Ambulatory ?Location: Ambulatory outpatient facility ?Delivery: Face-to-face Provider: Edward Jolly, MD ?Specialty: Interventional Pain Management ?Specialty designation: 09 ?Location: Outpatient facility ?Ref. Prov.: [facility]   ? ?Primary Reason for Visit: Interventional Pain Management Treatment. ?CC: Back Pain ? ?Procedure:          ? Type: Lumbar Facet, Medial Branch Radiofrequency Ablation (RFA) #1  ?Laterality: Left  ?Level: L3, L4, L5,  Medial Branch Level(s). These levels will denervate the L3-4 and L5-S1 lumbar facet joints.  ?Imaging: Fluoroscopic guidance ?Anesthesia: Local anesthesia (1-2% Lidocaine) ? ?Sedation: Moderate conscious sedation. ?DOS: 08/01/2021  ?Performed by: Edward Jolly, MD ? ?Purpose: Therapeutic/Palliative ?Indications: Low back pain severe enough to impact quality of life or function. ?Indications: ?1. Lumbar facet arthropathy   ?2. Lumbar spondylosis   ?3. Chronic pain syndrome   ? ?Tabitha Williams has been dealing with the above chronic pain for longer than three months and has either failed to respond, was unable to tolerate, or simply did not get enough benefit from other more conservative therapies including, but not limited to: ?1. Over-the-counter medications ?2. Anti-inflammatory medications ?3. Muscle relaxants ?4. Membrane stabilizers ?5. Opioids ?6. Physical therapy and/or chiropractic manipulation ?7. Modalities (Heat, ice, etc.) ?8.  Invasive techniques such as nerve blocks. ?Tabitha Williams has attained more than 50% relief of the pain from a series of diagnostic injections conducted in separate occasions. ? ?Pain Score: ?Pre-procedure: 4 /10 ?Post-procedure: 4 /10 ? ?  ? ?Position / Prep / Materials:  ?Position: Prone  ?Prep solution: DuraPrep (Iodine Povacrylex [0.7% available iodine] and Isopropyl Alcohol, 74% w/w) ?Prep Area: Entire Lumbosacral Region (Lower back from mid-thoracic region to end of tailbone and from flank to flank.) ?Materials:  ?Tray: RFA (Radiofrequency) tray ?Needle(s):  ?Type: RFA (Teflon-coated radiofrequency ablation needles) ?Gauge (G): 22  ?Length: Regular (10cm) ?Qty: 3 ? ?Pre-op H&P Assessment:  ?Tabitha Williams is a 46 y.o. (year old), female patient, seen today for interventional treatment. She  has a past surgical history that includes Cholecystectomy; laparoscopic supracervical hysterectomy (2011); Cesarean section; Gallbladder surgery; and Colonoscopy with propofol (N/A, 06/03/2019). Tabitha Williams has a current medication list which includes the following prescription(s): alprazolam, clonidine, clotrimazole, coenzyme q10, deutetrabenazine, flovent hfa, hydrocortisone, victoza, lisinopril-hydrochlorothiazide, lurasidone, magnesium oxide, nortriptyline, quetiapine fumarate, venlafaxine, vitamin d (ergocalciferol), and bac, and the following Facility-Administered Medications: fentanyl, lactated ringers, and midazolam. Her primarily concern today is the Back Pain ? ?Initial Vital Signs:  ?Pulse/HCG Rate: 98  ?Temp:  ?(!) 97.1 ?F (36.2 ?C) ?Resp: 16 ?BP: 112/78 ?SpO2:   ? ?BMI: Estimated body mass index is 43.94 kg/m? as calculated from the following: ?  Height as of this encounter: 5' 4.5" (1.638 m). ?  Weight as of this encounter: 260 lb (117.9 kg). ? ?Risk Assessment: ?Allergies: Reviewed. She is allergic to morphine and related, sulfa antibiotics, adhesive [tape], and elemental sulfur.  ?Allergy Precautions:  None required ?Coagulopathies: Reviewed. None identified.  ?Blood-thinner therapy: None at this time ?Active Infection(s): Reviewed. None identified. Tabitha Williams is afebrile ? ?Site Confirmation: [name] was asked to confirm the procedure and laterality before marking  the site ?Procedure checklist: Completed ?Consent: Before the procedure and under the influence of no sedative(s), amnesic(s), or anxiolytics, the patient was informed of the treatment options, risks and possible complications. To fulfill our ethical and legal obligations, as recommended by the American Medical Association's Code of Ethics, I have informed the patient of my clinical impression; the nature and purpose of the treatment or procedure; the risks, benefits, and possible complications of the intervention; the alternatives, including doing nothing; the risk(s) and benefit(s) of the alternative treatment(s) or procedure(s); and the risk(s) and benefit(s) of doing nothing. ?The patient was provided information about the general risks and possible complications associated with the procedure. These may include, but are not limited to: failure to achieve desired goals, infection, bleeding, organ or nerve damage, allergic reactions, paralysis, and death. ?In addition, the patient was informed of those risks and complications associated to Spine-related procedures, such as failure to decrease pain; infection (i.e.: Meningitis, epidural or intraspinal abscess); bleeding (i.e.: epidural hematoma, subarachnoid hemorrhage, or any other type of intraspinal or peri-dural bleeding); organ or nerve damage (i.e.: Any type of peripheral nerve, nerve root, or spinal cord injury) with subsequent damage to sensory, motor, and/or autonomic systems, resulting in permanent pain, numbness, and/or weakness of one or several areas of the body; allergic reactions; (i.e.: anaphylactic reaction); and/or death. ?Furthermore, the patient was informed of those  risks and complications associated with the medications. These include, but are not limited to: allergic reactions (i.e.: anaphylactic or anaphylactoid reaction(s)); adrenal axis suppression; blood sugar elevation that in diabetics may result in ketoacidosis or comma; water retention that in patients with history of congestive heart failure may result in shortness of breath, pulmonary edema, and decompensation with resultant heart failure; weight gain; swelling or edema; medication-induced neural toxicity; particulate matter embolism and blood vessel occlusion with resultant organ, and/or nervous system infarction; and/or aseptic necrosis of one or more joints. ?Finally, the patient was informed that Medicine is not an exact science; therefore, there is also the possibility of unforeseen or unpredictable risks and/or possible complications that may result in a catastrophic outcome. The patient indicated having understood very clearly. We have given the patient no guarantees and we have made no promises. Enough time was given to the patient to ask questions, all of which were answered to the patient's satisfaction. Ms. Coss has indicated that she wanted to continue with the procedure. ?Attestation: I, the ordering provider, attest that I have discussed with the patient the benefits, risks, side-effects, alternatives, likelihood of achieving goals, and potential problems during recovery for the procedure that I have provided informed consent. ?Date  Time: 08/01/2021  8:06 AM ? ?Pre-Procedure Preparation:  ?Monitoring: As per clinic protocol. Respiration, ETCO2, SpO2, BP, heart rate and rhythm monitor placed and checked for adequate function ?Safety Precautions: Patient was assessed for positional comfort and pressure points before starting the procedure. ?Time-out: I initiated and conducted the "Time-out" before starting the procedure, as per protocol. The patient was asked to participate by confirming the  accuracy of the "Time Out" information. Verification of the correct person, site, and procedure were performed and confirmed by me, the nursing staff, and the patient. "Time-out" conducted as per [facility]

## 2021-08-01 NOTE — Patient Instructions (Signed)
___________________________________________________________________________________________ ? ?Post-Radiofrequency (RF) Discharge Instructions ? ?You have just completed a Radiofrequency Neurotomy.  The following instructions will provide you with information and guidelines for self-care upon discharge.  If at any time you have questions or concerns please call your physician. ?DO NOT DRIVE YOURSELF!! ? ?Instructions: ?Apply ice: Fill a plastic sandwich bag with crushed ice. Cover it with a small towel and apply to injection site. Apply for 15 minutes then remove x 15 minutes. Repeat sequence on day of procedure, until you go to bed. The purpose is to minimize swelling and discomfort after procedure. ?Apply heat: Apply heat to procedure site starting the day following the procedure. The purpose is to treat any soreness and discomfort from the procedure. ?Food intake: No eating limitations, unless stipulated above.  Nevertheless, if you have had sedation, you may experience some nausea.  In this case, it may be wise to wait at least two hours prior to resuming regular diet. ?Physical activities: Keep activities to a minimum for the first 8 hours after the procedure. For the first 24 hours after the procedure, do not drive a motor vehicle,  Operate heavy machinery, power tools, or handle any weapons.  Consider walking with the use of an assistive device or accompanied by an adult for the first 24 hours.  Do not drink alcoholic beverages including beer.  Do not make any important decisions or sign any legal documents. Go home and rest today.  Resume activities tomorrow, as tolerated.  Use caution in moving about as you may experience mild leg weakness.  Use caution in cooking, use of household electrical appliances and climbing steps. ?Driving: If you have received any sedation, you are not allowed to drive for 24 hours after your procedure. ?Blood thinner: Restart your blood thinner 6 hours after your procedure. (Only  for those taking blood thinners) ?Insulin: As soon as you can eat, you may resume your normal dosing schedule. (Only for those taking insulin) ?Medications: May resume pre-procedure medications.  Do not take any drugs, other than what has been prescribed to you. ?Infection prevention: Keep procedure site clean and dry. ?Post-procedure Pain Diary: Extremely important that this be done correctly and accurately. Recorded information will be used to determine the next step in treatment. ?Pain evaluated is that of treated area only. Do not include pain from an untreated area. ?Complete every hour, on the hour, for the initial 8 hours. Set an alarm to help you do this part accurately. ?Do not go to sleep and have it completed later. It will not be accurate. ?Follow-up appointment: Keep your follow-up appointment after the procedure. Usually 2-6 weeks after radiofrequency. Bring you pain diary. The information collected will be essential for your long-term care.  ? ?Expect: ?From numbing medicine (AKA: Local Anesthetics): Numbness or decrease in pain. ?Onset: Full effect within 15 minutes of injected. ?Duration: It will depend on the type of local anesthetic used. On the average, 1 to 8 hours.  ?From steroids (when added): Decrease in swelling or inflammation. Once inflammation is improved, relief of the pain will follow. ?Onset of benefits: Depends on the amount of swelling present. The more swelling, the longer it will take for the benefits to be seen. In some cases, up to 10 days. ?Duration: Steroids will stay in the system x 2 weeks. Duration of benefits will depend on multiple posibilities including persistent irritating factors. ?From procedure: Some discomfort is to be expected once the numbing medicine wears off. In the case of radiofrequency procedures,   this may last as long as 6 weeks. Additional post-procedure pain medication is provided for this. Discomfort is minimized if ice and heat are applied as  instructed. ? ?Call if: ?You experience numbness and weakness that gets worse with time, as opposed to wearing off. ?He experience any unusual bleeding, difficulty breathing, or loss of the ability to control your bowel and bladder. (This applies to Spinal procedures only) ?You experience any redness, swelling, heat, red streaks, elevated temperature, fever, or any other signs of a possible infection. ? ?Emergency Numbers: ?Durning business hours (Monday - Thursday, 8:00 AM - 4:00 PM) (Friday, 9:00 AM - 12:00 Noon): [phone] ?After hours: [phone] ?____________________________________________________________________________________________ ? Radiofrequency Ablation ?Radiofrequency ablation is a procedure that is performed to relieve pain. The procedure is often used for back, neck, or arm pain. Radiofrequency ablation involves the use of a machine that creates radio waves to make heat. During the procedure, the heat is applied to the nerve that carries the pain signal. The heat damages the nerve and interferes with the pain signal. Pain relief usually starts about 2 weeks after the procedure and lasts for 6 months to 1 year. ?Tell a health care provider about: ?Any allergies you have. ?All medicines you are taking, including vitamins, herbs, eye drops, creams, and over-the-counter medicines. ?Any problems you or family members have had with anesthetic medicines. ?Any bleeding problems you have. ?Any surgeries you have had. ?Any medical conditions you have. ?Whether you are pregnant or may be pregnant. ?What are the risks? ?Generally, this is a safe procedure. However, problems may occur, including: ?Pain or soreness at the injection site. ?Allergic reaction to medicines given during the procedure. ?Bleeding. ?Infection at the injection site. ?Damage to nerves or blood vessels. ?What happens before the procedure? ?When to stop eating and drinking ?Follow instructions from your health care provider about  what you may eat and drink before your procedure. These may include: ?8 hours before the procedure ?Stop eating most foods. Do not eat meat, fried foods, or fatty foods. ?Eat only light foods, such as toast or crackers. ?All liquids are okay except energy drinks and alcohol. ?6 hours before the procedure ?Stop eating. ?Drink only clear liquids, such as water, clear fruit juice, black coffee, plain tea, and sports drinks. ?Do not drink energy drinks or alcohol. ?2 hours before the procedure ?Stop drinking all liquids. ?You may be allowed to take medicine with small sips of water. ?If you do not follow your health care provider's instructions, your procedure may be delayed or canceled. ?Medicines ?Ask your health care provider about: ?Changing or stopping your regular medicines. This is especially important if you are taking diabetes medicines or blood thinners. ?Taking medicines such as aspirin and ibuprofen. These medicines can thin your blood. Do not take these medicines unless your health care provider tells you to take them. ?Taking over-the-counter medicines, vitamins, herbs, and supplements. ?General instructions ?Ask your health care provider what steps will be taken to help prevent infection. These steps may include: ?Removing hair at the procedure site. ?Washing skin with a germ-killing soap. ?Taking antibiotic medicine. ?If you will be going home right after the procedure, plan to have a responsible adult: ?Take you home from the hospital or clinic. You will not be allowed to drive. ?Care for you for the time you are told. ?What happens during the procedure? ? ?You will be awake during the procedure. You will need to be able to talk with the health care provider during   the procedure. ?An IV will be inserted into one of your veins. ?You will be given one or more of the following: ?A medicine to help you relax (sedative). ?A medicine to numb the area (local anesthetic). ?Your health care provider will insert  a radiofrequency needle into the area to be treated. This is done with the help of fluoroscopy. ?A wire that carries the radio waves (electrode) will be put through the radiofrequency needle. ?An electrical pul

## 2021-08-02 ENCOUNTER — Telehealth: Payer: Self-pay

## 2021-08-02 NOTE — Telephone Encounter (Signed)
Post procedure phone call.  Patient states he is doing good.  

## 2021-08-15 ENCOUNTER — Ambulatory Visit
Admission: RE | Admit: 2021-08-15 | Discharge: 2021-08-15 | Disposition: A | Discharge status: Home / Self Care | Payer: Medicare HMO | Source: Ambulatory Visit | Attending: Student in an Organized Health Care Education/Training Program | Admitting: Student in an Organized Health Care Education/Training Program

## 2021-08-15 ENCOUNTER — Ambulatory Visit (HOSPITAL_BASED_OUTPATIENT_CLINIC_OR_DEPARTMENT_OTHER): Payer: Medicare HMO | Admitting: Student in an Organized Health Care Education/Training Program

## 2021-08-15 ENCOUNTER — Encounter: Payer: Self-pay | Admitting: Student in an Organized Health Care Education/Training Program

## 2021-08-15 VITALS — BP 102/67 | HR 92 | Temp 97.3°F | Resp 18 | Ht 64.5 in | Wt 252.0 lb

## 2021-08-15 DIAGNOSIS — G894 Chronic pain syndrome: Secondary | ICD-10-CM | POA: Insufficient documentation

## 2021-08-15 DIAGNOSIS — M47816 Spondylosis without myelopathy or radiculopathy, lumbar region: Secondary | ICD-10-CM | POA: Insufficient documentation

## 2021-08-15 MED ORDER — ROPIVACAINE HCL 2 MG/ML IJ SOLN
9.0000 mL | Freq: Once | INTRAMUSCULAR | Status: AC
  Administered 2021-08-15: 9 mL via PERINEURAL

## 2021-08-15 MED ORDER — FENTANYL CITRATE (PF) 100 MCG/2ML IJ SOLN
25.0000 ug | INTRAMUSCULAR | Status: DC | PRN
  Administered 2021-08-15: 100 ug via INTRAVENOUS

## 2021-08-15 MED ORDER — LIDOCAINE HCL (PF) 2 % IJ SOLN
INTRAMUSCULAR | Status: AC
  Filled 2021-08-15: qty 15

## 2021-08-15 MED ORDER — LIDOCAINE HCL 2 % IJ SOLN
20.0000 mL | Freq: Once | INTRAMUSCULAR | Status: AC
  Administered 2021-08-15: 100 mg

## 2021-08-15 MED ORDER — MIDAZOLAM HCL 5 MG/5ML IJ SOLN
0.5000 mg | Freq: Once | INTRAMUSCULAR | Status: AC
  Administered 2021-08-15: 1 mg via INTRAVENOUS

## 2021-08-15 MED ORDER — FENTANYL CITRATE (PF) 100 MCG/2ML IJ SOLN
INTRAMUSCULAR | Status: AC
  Filled 2021-08-15: qty 2

## 2021-08-15 MED ORDER — DEXAMETHASONE SODIUM PHOSPHATE 10 MG/ML IJ SOLN
INTRAMUSCULAR | Status: AC
  Filled 2021-08-15: qty 1

## 2021-08-15 MED ORDER — ROPIVACAINE HCL 2 MG/ML IJ SOLN
INTRAMUSCULAR | Status: AC
Start: 2021-08-15 — End: ?
  Filled 2021-08-15: qty 20

## 2021-08-15 MED ORDER — LACTATED RINGERS IV SOLN
1000.0000 mL | Freq: Once | INTRAVENOUS | Status: AC
  Administered 2021-08-15: 1000 mL via INTRAVENOUS

## 2021-08-15 MED ORDER — MIDAZOLAM HCL 5 MG/5ML IJ SOLN
INTRAMUSCULAR | Status: AC
Start: 2021-08-15 — End: ?
  Filled 2021-08-15: qty 5

## 2021-08-15 MED ORDER — DEXAMETHASONE SODIUM PHOSPHATE 10 MG/ML IJ SOLN
10.0000 mg | Freq: Once | INTRAMUSCULAR | Status: AC
  Administered 2021-08-15: 10 mg

## 2021-08-15 NOTE — Patient Instructions (Signed)
___________________________________________________________________________________________ ? ?Post-Radiofrequency (RF) Discharge Instructions ? ?You have just completed a Radiofrequency Neurotomy.  The following instructions will provide you with information and guidelines for self-care upon discharge.  If at any time you have questions or concerns please call your physician. ?DO NOT DRIVE YOURSELF!! ? ?Instructions: ?Apply ice: Fill a plastic sandwich bag with crushed ice. Cover it with a small towel and apply to injection site. Apply for 15 minutes then remove x 15 minutes. Repeat sequence on day of procedure, until you go to bed. The purpose is to minimize swelling and discomfort after procedure. ?Apply heat: Apply heat to procedure site starting the day following the procedure. The purpose is to treat any soreness and discomfort from the procedure. ?Food intake: No eating limitations, unless stipulated above.  Nevertheless, if you have had sedation, you may experience some nausea.  In this case, it may be wise to wait at least two hours prior to resuming regular diet. ?Physical activities: Keep activities to a minimum for the first 8 hours after the procedure. For the first 24 hours after the procedure, do not drive a motor vehicle,  Operate heavy machinery, power tools, or handle any weapons.  Consider walking with the use of an assistive device or accompanied by an adult for the first 24 hours.  Do not drink alcoholic beverages including beer.  Do not make any important decisions or sign any legal documents. Go home and rest today.  Resume activities tomorrow, as tolerated.  Use caution in moving about as you may experience mild leg weakness.  Use caution in cooking, use of household electrical appliances and climbing steps. ?Driving: If you have received any sedation, you are not allowed to drive for 24 hours after your procedure. ?Blood thinner: Restart your blood thinner 6 hours after your procedure. (Only  for those taking blood thinners) ?Insulin: As soon as you can eat, you may resume your normal dosing schedule. (Only for those taking insulin) ?Medications: May resume pre-procedure medications.  Do not take any drugs, other than what has been prescribed to you. ?Infection prevention: Keep procedure site clean and dry. ?Post-procedure Pain Diary: Extremely important that this be done correctly and accurately. Recorded information will be used to determine the next step in treatment. ?Pain evaluated is that of treated area only. Do not include pain from an untreated area. ?Complete every hour, on the hour, for the initial 8 hours. Set an alarm to help you do this part accurately. ?Do not go to sleep and have it completed later. It will not be accurate. ?Follow-up appointment: Keep your follow-up appointment after the procedure. Usually 2-6 weeks after radiofrequency. Bring you pain diary. The information collected will be essential for your long-term care.  ? ?Expect: ?From numbing medicine (AKA: Local Anesthetics): Numbness or decrease in pain. ?Onset: Full effect within 15 minutes of injected. ?Duration: It will depend on the type of local anesthetic used. On the average, 1 to 8 hours.  ?From steroids (when added): Decrease in swelling or inflammation. Once inflammation is improved, relief of the pain will follow. ?Onset of benefits: Depends on the amount of swelling present. The more swelling, the longer it will take for the benefits to be seen. In some cases, up to 10 days. ?Duration: Steroids will stay in the system x 2 weeks. Duration of benefits will depend on multiple posibilities including persistent irritating factors. ?From procedure: Some discomfort is to be expected once the numbing medicine wears off. In the case of radiofrequency procedures,   this may last as long as 6 weeks. Additional post-procedure pain medication is provided for this. Discomfort is minimized if ice and heat are applied as  instructed. ? ?Call if: ?You experience numbness and weakness that gets worse with time, as opposed to wearing off. ?He experience any unusual bleeding, difficulty breathing, or loss of the ability to control your bowel and bladder. (This applies to Spinal procedures only) ?You experience any redness, swelling, heat, red streaks, elevated temperature, fever, or any other signs of a possible infection. ? ?Emergency Numbers: ?Durning business hours (Monday - Thursday, 8:00 AM - 4:00 PM) (Friday, 9:00 AM - 12:00 Noon): [phone] ?After hours: [phone] ?____________________________________________________________________________________________ ? Radiofrequency Ablation ?Radiofrequency ablation is a procedure that is performed to relieve pain. The procedure is often used for back, neck, or arm pain. Radiofrequency ablation involves the use of a machine that creates radio waves to make heat. During the procedure, the heat is applied to the nerve that carries the pain signal. The heat damages the nerve and interferes with the pain signal. Pain relief usually starts about 2 weeks after the procedure and lasts for 6 months to 1 year. ?Tell a health care provider about: ?Any allergies you have. ?All medicines you are taking, including vitamins, herbs, eye drops, creams, and over-the-counter medicines. ?Any problems you or family members have had with anesthetic medicines. ?Any bleeding problems you have. ?Any surgeries you have had. ?Any medical conditions you have. ?Whether you are pregnant or may be pregnant. ?What are the risks? ?Generally, this is a safe procedure. However, problems may occur, including: ?Pain or soreness at the injection site. ?Allergic reaction to medicines given during the procedure. ?Bleeding. ?Infection at the injection site. ?Damage to nerves or blood vessels. ?What happens before the procedure? ?When to stop eating and drinking ?Follow instructions from your health care provider about  what you may eat and drink before your procedure. These may include: ?8 hours before the procedure ?Stop eating most foods. Do not eat meat, fried foods, or fatty foods. ?Eat only light foods, such as toast or crackers. ?All liquids are okay except energy drinks and alcohol. ?6 hours before the procedure ?Stop eating. ?Drink only clear liquids, such as water, clear fruit juice, black coffee, plain tea, and sports drinks. ?Do not drink energy drinks or alcohol. ?2 hours before the procedure ?Stop drinking all liquids. ?You may be allowed to take medicine with small sips of water. ?If you do not follow your health care provider's instructions, your procedure may be delayed or canceled. ?Medicines ?Ask your health care provider about: ?Changing or stopping your regular medicines. This is especially important if you are taking diabetes medicines or blood thinners. ?Taking medicines such as aspirin and ibuprofen. These medicines can thin your blood. Do not take these medicines unless your health care provider tells you to take them. ?Taking over-the-counter medicines, vitamins, herbs, and supplements. ?General instructions ?Ask your health care provider what steps will be taken to help prevent infection. These steps may include: ?Removing hair at the procedure site. ?Washing skin with a germ-killing soap. ?Taking antibiotic medicine. ?If you will be going home right after the procedure, plan to have a responsible adult: ?Take you home from the hospital or clinic. You will not be allowed to drive. ?Care for you for the time you are told. ?What happens during the procedure? ? ?You will be awake during the procedure. You will need to be able to talk with the health care provider during   the procedure. ?An IV will be inserted into one of your veins. ?You will be given one or more of the following: ?A medicine to help you relax (sedative). ?A medicine to numb the area (local anesthetic). ?Your health care provider will insert  a radiofrequency needle into the area to be treated. This is done with the help of fluoroscopy. ?A wire that carries the radio waves (electrode) will be put through the radiofrequency needle. ?An electrical pul

## 2021-08-15 NOTE — Progress Notes (Signed)
PROVIDER NOTE: Interpretation of information contained herein should be left to medically-trained personnel. Specific patient instructions are provided elsewhere under "Patient Instructions" section of medical record. This document was created in part using STT-dictation technology, any transcriptional errors that may result from this process are unintentional.  ?Patient: Tabitha Williams ?Type: Established ?DOB: 03/02/1976 ?MRN: [id number] ?PCP: [facility]  Service: Procedure ?DOS: 08/15/2021 ?Setting: Ambulatory ?Location: Ambulatory outpatient facility ?Delivery: Face-to-face Provider: Edward Jolly, MD ?Specialty: Interventional Pain Management ?Specialty designation: 09 ?Location: Outpatient facility ?Ref. Prov.: [facility]   ? ?Primary Reason for Visit: Interventional Pain Management Treatment. ?CC: Back Pain ? ?Procedure:          ? Type: Lumbar Facet, Medial Branch Radiofrequency Ablation (RFA) #1  ?Laterality: Right  ?Level: L3, L4, L5,  Medial Branch Level(s). These levels will denervate the L3-4 and L5-S1 lumbar facet joints.  ?Imaging: Fluoroscopic guidance ?Anesthesia: Local anesthesia (1-2% Lidocaine) ? ?Sedation: Moderate conscious sedation. ?DOS: 08/15/2021  ?Performed by: Edward Jolly, MD ? ?Purpose: Therapeutic/Palliative ?Indications: Low back pain severe enough to impact quality of life or function. ?Indications: ?1. Lumbar facet arthropathy   ?2. Lumbar spondylosis   ?3. Chronic pain syndrome   ? ?Tabitha Williams has been dealing with the above chronic pain for longer than three months and has either failed to respond, was unable to tolerate, or simply did not get enough benefit from other more conservative therapies including, but not limited to: ?1. Over-the-counter medications ?2. Anti-inflammatory medications ?3. Muscle relaxants ?4. Membrane stabilizers ?5. Opioids ?6. Physical therapy and/or chiropractic manipulation ?7. Modalities (Heat, ice,  etc.) ?8. Invasive techniques such as nerve blocks. ?Tabitha Williams has attained more than 50% relief of the pain from a series of diagnostic injections conducted in separate occasions. ? ?Pain Score: ?Pre-procedure: 2 /10 ?Post-procedure: 0-No pain/10 ? ?  ? ?Position / Prep / Materials:  ?Position: Prone  ?Prep solution: DuraPrep (Iodine Povacrylex [0.7% available iodine] and Isopropyl Alcohol, 74% w/w) ?Prep Area: Entire Lumbosacral Region (Lower back from mid-thoracic region to end of tailbone and from flank to flank.) ?Materials:  ?Tray: RFA (Radiofrequency) tray ?Needle(s):  ?Type: RFA (Teflon-coated radiofrequency ablation needles) ?Gauge (G): 22  ?Length: Regular (10cm) ?Qty: 3 ? ?Pre-op H&P Assessment:  ?Tabitha Williams is a 46 y.o. (year old), female patient, seen today for interventional treatment. She  has a past surgical history that includes Cholecystectomy; laparoscopic supracervical hysterectomy (2011); Cesarean section; Gallbladder surgery; and Colonoscopy with propofol (N/A, 06/03/2019). Tabitha Williams has a current medication list which includes the following prescription(s): alprazolam, bac, clonidine, clotrimazole, coenzyme q10, deutetrabenazine, flovent hfa, hydrocortisone, victoza, lisinopril-hydrochlorothiazide, lurasidone, magnesium oxide, nortriptyline, quetiapine fumarate, venlafaxine, and vitamin d (ergocalciferol), and the following Facility-Administered Medications: fentanyl. Her primarily concern today is the Back Pain ? ?Initial Vital Signs:  ?Pulse/HCG Rate: 92ECG Heart Rate: 88 ?Temp:  ?(!) 97.3 ?F (36.3 ?C) ?Resp: 16 ?BP: (!) 114/91 ?SpO2: 98 % ? ?BMI: Estimated body mass index is 42.59 kg/m? as calculated from the following: ?  Height as of this encounter: 5' 4.5" (1.638 m). ?  Weight as of this encounter: 252 lb (114.3 kg). ? ?Risk Assessment: ?Allergies: Reviewed. She is allergic to morphine and related, sulfa antibiotics, adhesive [tape], and elemental sulfur.  ?Allergy  Precautions: None required ?Coagulopathies: Reviewed. None identified.  ?Blood-thinner therapy: None at this time ?Active Infection(s): Reviewed. None identified. Tabitha Williams is afebrile ? ?Site Confirmation: Tabitha Williams was asked to confirm the procedure and laterality before marking the  site ?Procedure checklist: Completed ?Consent: Before the procedure and under the influence of no sedative(s), amnesic(s), or anxiolytics, the patient was informed of the treatment options, risks and possible complications. To fulfill our ethical and legal obligations, as recommended by the American Medical Association's Code of Ethics, I have informed the patient of my clinical impression; the nature and purpose of the treatment or procedure; the risks, benefits, and possible complications of the intervention; the alternatives, including doing nothing; the risk(s) and benefit(s) of the alternative treatment(s) or procedure(s); and the risk(s) and benefit(s) of doing nothing. ?The patient was provided information about the general risks and possible complications associated with the procedure. These may include, but are not limited to: failure to achieve desired goals, infection, bleeding, organ or nerve damage, allergic reactions, paralysis, and death. ?In addition, the patient was informed of those risks and complications associated to Spine-related procedures, such as failure to decrease pain; infection (i.e.: Meningitis, epidural or intraspinal abscess); bleeding (i.e.: epidural hematoma, subarachnoid hemorrhage, or any other type of intraspinal or peri-dural bleeding); organ or nerve damage (i.e.: Any type of peripheral nerve, nerve root, or spinal cord injury) with subsequent damage to sensory, motor, and/or autonomic systems, resulting in permanent pain, numbness, and/or weakness of one or several areas of the body; allergic reactions; (i.e.: anaphylactic reaction); and/or death. ?Furthermore, the patient was  informed of those risks and complications associated with the medications. These include, but are not limited to: allergic reactions (i.e.: anaphylactic or anaphylactoid reaction(s)); adrenal axis suppression; blood sugar elevation that in diabetics may result in ketoacidosis or comma; water retention that in patients with history of congestive heart failure may result in shortness of breath, pulmonary edema, and decompensation with resultant heart failure; weight gain; swelling or edema; medication-induced neural toxicity; particulate matter embolism and blood vessel occlusion with resultant organ, and/or nervous system infarction; and/or aseptic necrosis of one or more joints. ?Finally, the patient was informed that Medicine is not an exact science; therefore, there is also the possibility of unforeseen or unpredictable risks and/or possible complications that may result in a catastrophic outcome. The patient indicated having understood very clearly. We have given the patient no guarantees and we have made no promises. Enough time was given to the patient to ask questions, all of which were answered to the patient's satisfaction. Tabitha Williams has indicated that she wanted to continue with the procedure. ?Attestation: I, the ordering provider, attest that I have discussed with the patient the benefits, risks, side-effects, alternatives, likelihood of achieving goals, and potential problems during recovery for the procedure that I have provided informed consent. ?Date  Time: 08/15/2021  7:49 AM ? ?Pre-Procedure Preparation:  ?Monitoring: As per clinic protocol. Respiration, ETCO2, SpO2, BP, heart rate and rhythm monitor placed and checked for adequate function ?Safety Precautions: Patient was assessed for positional comfort and pressure points before starting the procedure. ?Time-out: I initiated and conducted the "Time-out" before starting the procedure, as per protocol. The patient was asked to participate by  confirming the accuracy of the "Time Out" information. Verification of the correct person, site, and procedure were performed and confirmed by me, the nursing staff, and the patient. "Time-out" conducted as per Joint Commis

## 2021-08-15 NOTE — Progress Notes (Signed)
Safety precautions to be maintained throughout the outpatient stay will include: orient to surroundings, keep bed in low position, maintain call bell within reach at all times, provide assistance with transfer out of bed and ambulation.  

## 2021-08-16 ENCOUNTER — Telehealth: Payer: Self-pay | Admitting: *Deleted

## 2021-08-16 NOTE — Telephone Encounter (Signed)
No problems post procedure. 

## 2021-09-07 ENCOUNTER — Telehealth: Payer: Self-pay | Admitting: Student in an Organized Health Care Education/Training Program

## 2021-09-07 NOTE — Telephone Encounter (Signed)
Pt stated that she was still having pain after her procedure 4/17. Please call Pt with some advice. ?

## 2021-09-27 ENCOUNTER — Other Ambulatory Visit: Payer: Self-pay | Admitting: Gastroenterology

## 2021-09-28 ENCOUNTER — Encounter: Payer: Self-pay | Admitting: Student in an Organized Health Care Education/Training Program

## 2021-09-28 ENCOUNTER — Ambulatory Visit
Payer: Medicare HMO | Attending: Student in an Organized Health Care Education/Training Program | Admitting: Student in an Organized Health Care Education/Training Program

## 2021-09-28 DIAGNOSIS — G894 Chronic pain syndrome: Secondary | ICD-10-CM

## 2021-09-28 DIAGNOSIS — M47816 Spondylosis without myelopathy or radiculopathy, lumbar region: Secondary | ICD-10-CM

## 2021-09-28 DIAGNOSIS — M4316 Spondylolisthesis, lumbar region: Secondary | ICD-10-CM

## 2021-09-28 MED ORDER — CELECOXIB 100 MG PO CAPS: 100.0000 mg | ORAL_CAPSULE | Freq: Two times a day (BID) | ORAL | 0 refills | Status: AC

## 2021-09-28 NOTE — Assessment & Plan Note (Signed)
Limited response to RFA in regards to her low back pain.  We will continue to monitor.  Recommend trial of Celebrex as below to see if that provides temporary pain relief.  Future considerations could include Sprint peripheral nerve stimulation of the L4 medial branch versus SI joint work-up, diagnostic SI joint injection.

## 2021-09-28 NOTE — Progress Notes (Signed)
Patient: Tabitha Williams  Service Category: E/M  Provider: [name], MD  DOB: [birth year]  DOS: 09/28/2021  Location: Office  MRN: [id number]  Setting: Ambulatory outpatient  Referring Provider: [facility] [place]  Type: Established Patient  Specialty: Interventional Pain Management  PCP: [facility] [place]  Location: Remote location  Delivery: TeleHealth     Virtual Encounter - Pain Management PROVIDER NOTE: Information contained herein reflects review and annotations entered in association with encounter. Interpretation of such information and data should be left to medically-trained personnel. Information provided to patient can be located elsewhere in the medical record under "Patient Instructions". Document created using STT-dictation technology, any transcriptional errors that may result from process are unintentional.    Contact & Pharmacy Preferred: [phone] Home: [phone] (home) Mobile: [phone] (mobile) E-mail: [email or web address], [place] - [address] [place] [address] Phone: [phone] Fax: [phone]   Pre-screening  [name] offered "in-person" vs "virtual" encounter. She indicated preferring virtual for this encounter.   Reason COVID-19*  Social distancing based on CDC and AMA recommendations.   I contacted CHALICE PHILBERT on 09/28/2021 via telephone.      I clearly identified myself as [name], MD. I verified that I was speaking with the correct person using two identifiers (Name: DALANIE KISNER, and date of birth: Jul 31, 1975).  Consent I sought verbal advanced consent from [name] for virtual visit interactions. I informed Ms. Lecker of possible security and privacy concerns, risks, and limitations associated with providing "not-in-person" medical evaluation and management services. I also informed Ms. Kessen of the  availability of "in-person" appointments. Finally, I informed her that there would be a charge for the virtual visit and that she could be  personally, fully or partially, financially responsible for it. Ms. Cabell expressed understanding and agreed to proceed.   Historic Elements   [name] is a 46 y.o. year old, female patient evaluated today after our last contact on 09/07/2021. [name]  has a past medical history of Bipolar 1 disorder with moderate mania ([place]), Endometriosis, Mental health problem, and Rectal bleeding. She also  has a past surgical history that includes Cholecystectomy; laparoscopic supracervical hysterectomy (2011); Cesarean section; Gallbladder surgery; and Colonoscopy with propofol (N/A, 06/03/2019). Ms. Brents has a current medication list which includes the following prescription(s): alprazolam, bac, celecoxib, clonidine, clotrimazole, coenzyme q10, deutetrabenazine, flovent hfa, hydrocortisone, victoza, lisinopril-hydrochlorothiazide, lurasidone, magnesium oxide, nortriptyline, quetiapine fumarate, ubrelvy, venlafaxine, and vitamin d (ergocalciferol). She  reports that she has been smoking cigarettes. She has been smoking an average of 1 pack per day. She has never used smokeless tobacco. She reports that she does not currently use alcohol. She reports current drug use. Frequency: 7.00 times per week. Drug: Marijuana. Ms. Cove is allergic to morphine and related, sulfa antibiotics, adhesive [tape], and elemental sulfur.   HPI  Today, she is being contacted for a post-procedure assessment.   Post-procedure evaluation   Type: Lumbar Facet, Medial Branch Radiofrequency Ablation (RFA) #1  Laterality: Right  Level: L3, L4, L5,  Medial Branch Level(s). These levels will denervate the L3-4 and L5-S1 lumbar facet joints.  Imaging: Fluoroscopic guidance Anesthesia: Local anesthesia (1-2% Lidocaine)   Type: Lumbar Facet, Medial Branch  Radiofrequency Ablation (RFA) #1  Laterality: Left  Level: L3, L4, L5,  Medial Branch Level(s). These levels will denervate the L3-4 and L5-S1 lumbar facet joints.  Imaging: Fluoroscopic guidance Anesthesia: Local anesthesia (  1-2% Lidocaine)   Sedation: Moderate conscious sedation. DOS: 08/15/2021  Performed by: [name], MD  Purpose: Therapeutic/Palliative Indications: Low back pain severe enough to impact quality of life or function. Indications: 1. Lumbar facet arthropathy   2. Lumbar spondylosis   3. Chronic pain syndrome    [name] has been dealing with the above chronic pain for longer than three months and has either failed to respond, was unable to tolerate, or simply did not get enough benefit from other more conservative therapies including, but not limited to: 1. Over-the-counter medications 2. Anti-inflammatory medications 3. Muscle relaxants 4. Membrane stabilizers 5. Opioids 6. Physical therapy and/or chiropractic manipulation 7. Modalities (Heat, ice, etc.) 8. Invasive techniques such as nerve blocks. [name] has attained more than 50% relief of the pain from a series of diagnostic injections conducted in separate occasions.  Pain Score: Pre-procedure: 2 /10 Post-procedure: 0-No pain/10     Effectiveness:  Initial hour after procedure: 100 %  Subsequent 4-6 hours post-procedure: 100 %  Analgesia past initial 6 hours: 100 % (good pain relief x 24 hours and then the pain returned.  no longer a stabbing pain.  but still painful.)  Ongoing improvement:  Analgesic:  50% Function: [name] reports improvement in function ROM: [name] reports improvement in ROM   Laboratory Chemistry Profile   Renal Lab Results  Component Value Date   BUN 14 05/20/2019   BUN 14 05/20/2019   CREATININE 1.48 (H) 05/20/2019   CREATININE 1.48 (H) 05/20/2019   BCR 9 05/20/2019   BCR 9 05/20/2019   GFRAA 50 (L) 05/20/2019   GFRAA 50 (L)  05/20/2019   GFRNONAA 43 (L) 05/20/2019   GFRNONAA 43 (L) 05/20/2019    Hepatic Lab Results  Component Value Date   AST 12 01/23/2020   ALT 12 01/23/2020   ALBUMIN 4.6 01/23/2020   ALKPHOS 83 01/23/2020    Electrolytes Lab Results  Component Value Date   NA 140 05/20/2019   NA 140 05/20/2019   K 4.5 05/20/2019   K 4.5 05/20/2019   CL 102 05/20/2019   CL 102 05/20/2019   CALCIUM 10.1 05/20/2019   CALCIUM 10.1 05/20/2019    Bone Lab Results  Component Value Date   VD125OH2TOT 36 05/20/2019   [id number] 32 05/20/2019   [id number] <10 05/20/2019    Inflammation (CRP: Acute Phase) (ESR: Chronic Phase) Lab Results  Component Value Date   CRP 3 05/20/2019         Note: Above Lab results reviewed.   Assessment  The primary encounter diagnosis was Lumbar facet arthropathy. Diagnoses of Lumbar spondylosis, Chronic pain syndrome, and Spondylolisthesis at L4-L5 level were also pertinent to this visit.  Plan of Care  Problem-specific:  Lumbar spondylosis Limited response to RFA in regards to her low back pain.  We will continue to monitor.  Recommend trial of Celebrex as below to see if that provides temporary pain relief.  Future considerations could include Sprint peripheral nerve stimulation of the L4 medial branch versus SI joint work-up, diagnostic SI joint injection.  Ms. TERAN DAUGHENBAUGH has a current medication list which includes the following long-term medication(s): clonidine, flovent hfa, victoza, lisinopril-hydrochlorothiazide, lurasidone, nortriptyline, quetiapine fumarate, and venlafaxine.  Pharmacotherapy (Medications Ordered): Meds ordered this encounter  Medications   celecoxib (CELEBREX) 100 MG capsule    Sig: Take 1 capsule (100 mg total) by mouth 2 (two) times daily after a meal.    Dispense:  60 capsule  Refill:  0   Orders:  No orders of the defined types were placed in this encounter.  Follow-up plan:   Return in about 5 weeks (around  11/02/2021) for Medication Management, virtual (discuss Sprint PNS vs SI-J).     B/L L3,4,5 Fct block #1: 05/25/21; #2 06/22/21: L3,4,5 RFA: R 08/15/21, L:08-01-21     Recent Visits Date Type Provider Dept  08/15/21 Procedure visit [name], MD [facility]  08/01/21 Procedure visit [name], MD [facility]  07/20/21 Office Visit [name], MD [facility]  Showing recent visits within past 90 days and meeting all other requirements Today's Visits Date Type Provider Dept  09/28/21 Office Visit [name], MD [facility]  Showing today's visits and meeting all other requirements Future Appointments No visits were found meeting these conditions. Showing future appointments within next 90 days and meeting all other requirements  I discussed the assessment and treatment plan with the patient. The patient was provided an opportunity to ask questions and all were answered. The patient agreed with the plan and demonstrated an understanding of the instructions.  Patient advised to call back or seek an in-person evaluation if the symptoms or condition worsens.  Duration of encounter: 59mnutes.  Note by: BGillis Santa MD Date: 09/28/2021; Time: 3:05 PM

## 2021-10-26 ENCOUNTER — Encounter: Payer: Self-pay | Admitting: Student in an Organized Health Care Education/Training Program

## 2021-10-26 ENCOUNTER — Ambulatory Visit
Payer: Medicare HMO | Attending: Student in an Organized Health Care Education/Training Program | Admitting: Student in an Organized Health Care Education/Training Program

## 2021-10-26 DIAGNOSIS — G894 Chronic pain syndrome: Secondary | ICD-10-CM

## 2021-10-26 DIAGNOSIS — M47816 Spondylosis without myelopathy or radiculopathy, lumbar region: Secondary | ICD-10-CM | POA: Diagnosis not present

## 2021-10-26 NOTE — Progress Notes (Signed)
Patient: Tabitha Williams  Service Category: E/M  Provider: [name], MD  DOB: [birth year]  DOS: 10/26/2021  Location: Office  MRN: [id number]  Setting: Ambulatory outpatient  Referring Provider: [facility] [place]  Type: Established Patient  Specialty: Interventional Pain Management  PCP: [facility] [place]  Location: Remote location  Delivery: TeleHealth     Virtual Encounter - Pain Management PROVIDER NOTE: Information contained herein reflects review and annotations entered in association with encounter. Interpretation of such information and data should be left to medically-trained personnel. Information provided to patient can be located elsewhere in the medical record under "Patient Instructions". Document created using STT-dictation technology, any transcriptional errors that may result from process are unintentional.    Contact & Pharmacy Preferred: [phone] Home: [phone] (home) Mobile: [phone] (mobile) E-mail: [email or web address], [place] - [address] [place] [address] Phone: [phone] Fax: [phone]   Pre-screening  Tabitha Williams offered "in-person" vs "virtual" encounter. She indicated preferring virtual for this encounter.   Reason COVID-19*  Social distancing based on CDC and AMA recommendations.   I contacted RENETTA Williams on 10/26/2021 via telephone.      I clearly identified myself as [name], MD. I verified that I was speaking with the correct person using two identifiers (Name: Tabitha Williams, and date of birth: [birth year]).  Consent I sought verbal advanced consent from Tabitha Williams for virtual visit interactions. I informed Tabitha Williams of possible security and privacy concerns, risks, and limitations associated with providing "not-in-person" medical evaluation and management services. I also informed Tabitha Williams of the  availability of "in-person" appointments. Finally, I informed her that there would be a charge for the virtual visit and that she could be  personally, fully or partially, financially responsible for it. Tabitha Williams expressed understanding and agreed to proceed.   Historic Elements   Tabitha Williams is a 46 y.o. year old, female patient evaluated today after our last contact on 09/28/2021. Tabitha Williams  has a past medical history of Bipolar 1 disorder with moderate mania ([place]), Endometriosis, Mental health problem, and Rectal bleeding. She also  has a past surgical history that includes Cholecystectomy; laparoscopic supracervical hysterectomy (2011); Cesarean section; Gallbladder surgery; and Colonoscopy with propofol (N/A, 06/03/2019). Tabitha Williams has a current medication list which includes the following prescription(s): alprazolam, bac, celecoxib, clonidine, clotrimazole, coenzyme q10, deutetrabenazine, flovent hfa, hydrocortisone, victoza, lisinopril-hydrochlorothiazide, lurasidone, magnesium oxide, nortriptyline, quetiapine fumarate, ubrelvy, venlafaxine, and vitamin d (ergocalciferol). She  reports that she has been smoking cigarettes. She has been smoking an average of 1 pack per day. She has never used smokeless tobacco. She reports that she does not currently use alcohol. She reports current drug use. Frequency: 7.00 times per week. Drug: Marijuana. Tabitha Williams is allergic to morphine and related, sulfa antibiotics, adhesive [tape], and elemental sulfur.   HPI  Today, she is being contacted for follow-up evaluation  Patient states that she is doing slightly better after a course of Celebrex this last month.  She does have overall less pain in her lower back but there are certain days where it does flareup.  I informed her that we can continue to monitor her symptoms and consider repeating medial branch nerve blocks anytime after mid July.  She states that she will think about  this and if she continues to have persistent or worsening pain in her lower back, she will call us to schedule.  Laboratory Chemistry Profile   Renal Lab Results  Component Value Date   BUN 14 05/20/2019   BUN 14 05/20/2019   CREATININE 1.48 (H) 05/20/2019   CREATININE 1.48 (H) 05/20/2019   BCR 9 05/20/2019   BCR 9 05/20/2019   GFRAA 50 (L) 05/20/2019   GFRAA 50 (L) 05/20/2019   GFRNONAA 43 (L) 05/20/2019   GFRNONAA 43 (L) 05/20/2019    Hepatic Lab Results  Component Value Date   AST 12 01/23/2020   ALT 12 01/23/2020   ALBUMIN 4.6 01/23/2020   ALKPHOS 83 01/23/2020    Electrolytes Lab Results  Component Value Date   NA 140 05/20/2019   NA 140 05/20/2019   K 4.5 05/20/2019   K 4.5 05/20/2019   CL 102 05/20/2019   CL 102 05/20/2019   CALCIUM 10.1 05/20/2019   CALCIUM 10.1 05/20/2019    Bone Lab Results  Component Value Date   VD125OH2TOT 36 05/20/2019   [id number] 32 05/20/2019   [id number] <10 05/20/2019    Inflammation (CRP: Acute Phase) (ESR: Chronic Phase) Lab Results  Component Value Date   CRP 3 05/20/2019         Note: Above Lab results reviewed.  Imaging  SLEEP STUDY DOCUMENTS Ordered by an unspecified provider. SLEEP STUDY DOCUMENTS Ordered by an unspecified provider.  Assessment  The primary encounter diagnosis was Lumbar facet arthropathy. Diagnoses of Lumbar spondylosis and Chronic pain syndrome were also pertinent to this visit.  Plan of Care   1. Lumbar facet arthropathy - LUMBAR FACET(MEDIAL BRANCH NERVE BLOCK) MBNB; Standing  2. Lumbar spondylosis - LUMBAR FACET(MEDIAL BRANCH NERVE BLOCK) MBNB; Standing  3. Chronic pain syndrome - LUMBAR FACET(MEDIAL BRANCH NERVE BLOCK) MBNB; Standing    Orders:  Orders Placed This Encounter  Procedures   LUMBAR FACET(MEDIAL BRANCH NERVE BLOCK) MBNB    Standing Status:   Standing    Number of Occurrences:   2    Standing Expiration Date:   04/27/2022    Scheduling Instructions:      Procedure: Lumbar facet block (AKA.: Lumbosacral medial branch nerve block)     Side: Bilateral     Level: L3-4, L4-5, Facets ( L3, L4, L5, Medial Branch Nerves)     Sedation: ECT     Timeframe: As needed, patient will call    Order Specific Question:   Where will this procedure be performed?    Answer:   [facility]   Follow-up plan:   Return for PRN lumbar facets MBNB.     B/L L3,4,5 Fct block #1: 05/25/21; #2 06/22/21: L3,4,5 RFA: R 08/15/21, L:08-01-21      Recent Visits Date Type Provider Dept  09/28/21 Office Visit [name], MD [facility]  08/15/21 Procedure visit [name], MD [facility]  08/01/21 Procedure visit [name], MD [facility]  Showing recent visits within past 90 days and meeting all other requirements Today's Visits Date Type Provider Dept  10/26/21 Office Visit [name], MD [facility]  Showing today's visits and meeting all other requirements Future Appointments No visits were found meeting these conditions. Showing future appointments within next 90 days and meeting all other requirements  I discussed the assessment and treatment plan with the patient. The patient was provided an opportunity to ask questions and all were answered. The patient agreed with the plan and demonstrated an understanding of the instructions.  Patient advised to call back or seek an in-person evaluation if the symptoms or  condition worsens.  Duration of encounter: 23minutes.  Note by: [name], MD Date: 10/26/2021; Time: 3:25 PM

## 2021-10-28 ENCOUNTER — Telehealth: Payer: Self-pay | Admitting: Student in an Organized Health Care Education/Training Program

## 2021-10-28 NOTE — Telephone Encounter (Signed)
Patient lvmail at 10:21 stating her pcp Brittny Spangle has approved the increase in meds from Dr Cherylann Ratel, she is asking what she needs to do next. Please call patient

## 2021-10-31 NOTE — Telephone Encounter (Signed)
Patient called and states that her PCP is okay with going up on Nortriptyline but would like Dr Cherylann Ratel to prescribe.  Is taking 50 mg currently.  If she is to increase she will need a new  Rx.

## 2021-11-03 ENCOUNTER — Telehealth: Payer: Self-pay | Admitting: Student in an Organized Health Care Education/Training Program

## 2021-11-03 NOTE — Telephone Encounter (Signed)
Returned patient phone call to let her know what BL had said earlier.  I had left a message on her voicemail but she states that she  does not get her voicemail.  Explanation given.  Patient verbalizes u/o information.

## 2021-11-03 NOTE — Telephone Encounter (Signed)
Patient want see about her meds. Patient stated that she spoke with a nurse early today. Please give patient a call. Thanks

## 2021-11-03 NOTE — Telephone Encounter (Signed)
Voicemail left reflecting information sent back to me by BL.  Told her that if she has any questions to give me a call back.

## 2021-12-05 ENCOUNTER — Ambulatory Visit
Payer: Medicare HMO | Attending: Student in an Organized Health Care Education/Training Program | Admitting: Student in an Organized Health Care Education/Training Program

## 2021-12-05 ENCOUNTER — Encounter: Payer: Self-pay | Admitting: Student in an Organized Health Care Education/Training Program

## 2021-12-05 DIAGNOSIS — G894 Chronic pain syndrome: Secondary | ICD-10-CM

## 2021-12-05 DIAGNOSIS — M4310 Spondylolisthesis, site unspecified: Secondary | ICD-10-CM

## 2021-12-05 DIAGNOSIS — M4316 Spondylolisthesis, lumbar region: Secondary | ICD-10-CM | POA: Diagnosis not present

## 2021-12-05 DIAGNOSIS — M47816 Spondylosis without myelopathy or radiculopathy, lumbar region: Secondary | ICD-10-CM | POA: Diagnosis not present

## 2021-12-05 NOTE — Progress Notes (Signed)
Patient: Tabitha Williams  Service Category: E/M  Provider: [name], MD  DOB: 14-Apr-1976  DOS: 12/05/2021  Location: Office  MRN: [id number]  Setting: Ambulatory outpatient  Referring Provider: [facility] [place]  Type: Established Patient  Specialty: Interventional Pain Management  PCP: [facility] [place]  Location: Remote location  Delivery: TeleHealth     Virtual Encounter - Pain Management PROVIDER NOTE: Information contained herein reflects review and annotations entered in association with encounter. Interpretation of such information and data should be left to medically-trained personnel. Information provided to patient can be located elsewhere in the medical record under "Patient Instructions". Document created using STT-dictation technology, any transcriptional errors that may result from process are unintentional.    Contact & Pharmacy Preferred: [phone] Home: [phone] (home) Mobile: [phone] (mobile) E-mail: [email or web address] [place] [address] Phone: [phone] Fax: [phone]   Pre-screening  [name] offered "in-person" vs "virtual" encounter. She indicated preferring virtual for this encounter.   Reason COVID-19*  Social distancing based on CDC and AMA recommendations.   I contacted LADAVIA LINDENBAUM on 12/05/2021 via telephone.      I clearly identified myself as [name], MD. I verified that I was speaking with the correct person using two identifiers (Name: Tabitha Williams, and date of birth: 07-26-1975).  Consent I sought verbal advanced consent from Tabitha Williams for virtual visit interactions. I informed Ms. Tull of possible security and privacy concerns, risks, and limitations associated with providing "not-in-person" medical evaluation and management services. I also informed Ms. Keough of the  availability of "in-person" appointments. Finally, I informed her that there would be a charge for the virtual visit and that she could be  personally, fully or partially, financially responsible for it. Ms. Heatherington expressed understanding and agreed to proceed.   Historic Elements   [name] is a 46 y.o. year old, female patient evaluated today after our last contact on 11/03/2021. [name]  has a past medical history of Bipolar 1 disorder with moderate mania ([place]), Endometriosis, Mental health problem, and Rectal bleeding. She also  has a past surgical history that includes Cholecystectomy; laparoscopic supracervical hysterectomy (2011); Cesarean section; Gallbladder surgery; and Colonoscopy with propofol (N/A, 06/03/2019). [name] has a current medication list which includes the following prescription(s): alprazolam, bac, clonidine, clotrimazole, coenzyme q10, deutetrabenazine, flovent hfa, hydrocortisone, victoza, lisinopril-hydrochlorothiazide, lurasidone, magnesium oxide, nortriptyline, quetiapine fumarate, ubrelvy, venlafaxine, and vitamin d (ergocalciferol). She  reports that she has been smoking cigarettes. She has been smoking an average of 1 pack per day. She has never used smokeless tobacco. She reports that she does not currently use alcohol. She reports current drug use. Frequency: 7.00 times per week. Drug: Marijuana. [name] is allergic to morphine and related, sulfa antibiotics, adhesive [tape], and elemental sulfur.   HPI  Today, she is being contacted for worsening of previously known (established) problem   Patient is having increased lumbar spine pain related to lumbar spondylosis and facet arthropathy.  She has pain with facet loading.  It is too soon to repeat lumbar radiofrequency ablation of the medial branch nerves at this time since it has not been 6 months.  In order to provide pain relief and improvement in functional status, we  discussed repeating medial branch nerve blocks bilaterally at L3, L4, L5 which should help out to her low back pain until we are able to  repeat lumbar radiofrequency ablation.  Risk and benefits were reviewed and patient would like to proceed.    Laboratory Chemistry Profile   Renal Lab Results  Component Value Date   BUN 14 05/20/2019   BUN 14 05/20/2019   CREATININE 1.48 (H) 05/20/2019   CREATININE 1.48 (H) 05/20/2019   BCR 9 05/20/2019   BCR 9 05/20/2019   GFRAA 50 (L) 05/20/2019   GFRAA 50 (L) 05/20/2019   GFRNONAA 43 (L) 05/20/2019   GFRNONAA 43 (L) 05/20/2019    Hepatic Lab Results  Component Value Date   AST 12 01/23/2020   ALT 12 01/23/2020   ALBUMIN 4.6 01/23/2020   ALKPHOS 83 01/23/2020    Electrolytes Lab Results  Component Value Date   NA 140 05/20/2019   NA 140 05/20/2019   K 4.5 05/20/2019   K 4.5 05/20/2019   CL 102 05/20/2019   CL 102 05/20/2019   CALCIUM 10.1 05/20/2019   CALCIUM 10.1 05/20/2019    Bone Lab Results  Component Value Date   VD125OH2TOT 36 05/20/2019   [id number] 32 05/20/2019   [id number] <10 05/20/2019    Inflammation (CRP: Acute Phase) (ESR: Chronic Phase) Lab Results  Component Value Date   CRP 3 05/20/2019         Note: Above Lab results reviewed.  Assessment  The primary encounter diagnosis was Lumbar facet arthropathy. Diagnoses of Lumbar spondylosis, Spondylolisthesis at L4-L5 level, Pars defect with spondylolisthesis, and Chronic pain syndrome were also pertinent to this visit.  Plan of Care  1. Lumbar facet arthropathy - LUMBAR FACET(MEDIAL BRANCH NERVE BLOCK) MBNB; Future  2. Lumbar spondylosis - LUMBAR FACET(MEDIAL BRANCH NERVE BLOCK) MBNB; Future  3. Spondylolisthesis at L4-L5 level - LUMBAR FACET(MEDIAL BRANCH NERVE BLOCK) MBNB; Future  4. Pars defect with spondylolisthesis - LUMBAR FACET(MEDIAL BRANCH NERVE BLOCK) MBNB; Future  5. Chronic pain syndrome - LUMBAR FACET(MEDIAL BRANCH NERVE BLOCK) MBNB;  Future   Orders Placed This Encounter  Procedures   LUMBAR FACET(MEDIAL BRANCH NERVE BLOCK) MBNB    Standing Status:   Future    Standing Expiration Date:   03/07/2022    Scheduling Instructions:     Procedure: Lumbar facet block (AKA.: Lumbosacral medial branch nerve block)     Side: Bilateral     Level: L3-4 & L5-S1 Facets ( L3, L4, L5,  Medial Branch Nerves)     Sedation: ECT     Timeframe: ASAA    Order Specific Question:   Where will this procedure be performed?    Answer:   [facility]   Follow-up plan:   Return in about 2 days (around 12/07/2021) for B/L L3, 4, 5 MBNB , ECT.     B/L L3,4,5 Fct block #1: 05/25/21; #2 06/22/21: L3,4,5 RFA: R 08/15/21, L:08-01-21       Recent Visits Date Type Provider Dept  10/26/21 Office Visit [name], MD [facility]  09/28/21 Office Visit [name], MD [facility]  Showing recent visits within past 90 days and meeting all other requirements Today's Visits Date Type Provider Dept  12/05/21 Office Visit [name], MD [facility]  Showing today's visits and meeting all other requirements Future Appointments No visits were found meeting these conditions. Showing future appointments within next 90 days and meeting all other requirements  I discussed the assessment and treatment plan with the patient. The patient was provided an opportunity to ask questions and all were answered. The patient agreed with the plan and  demonstrated an understanding of the instructions.  Patient advised to call back or seek an in-person evaluation if the symptoms or condition worsens.  Duration of encounter: 64minutes.  Note by: [name], MD Date: 12/05/2021; Time: 2:58 PM

## 2021-12-19 ENCOUNTER — Ambulatory Visit
Admission: RE | Admit: 2021-12-19 | Discharge: 2021-12-19 | Disposition: A | Discharge status: Home / Self Care | Payer: Medicare HMO | Source: Ambulatory Visit | Attending: Student in an Organized Health Care Education/Training Program | Admitting: Student in an Organized Health Care Education/Training Program

## 2021-12-19 ENCOUNTER — Ambulatory Visit
Payer: Medicare HMO | Attending: Student in an Organized Health Care Education/Training Program | Admitting: Student in an Organized Health Care Education/Training Program

## 2021-12-19 ENCOUNTER — Encounter: Payer: Self-pay | Admitting: Student in an Organized Health Care Education/Training Program

## 2021-12-19 DIAGNOSIS — M4310 Spondylolisthesis, site unspecified: Secondary | ICD-10-CM | POA: Insufficient documentation

## 2021-12-19 DIAGNOSIS — G894 Chronic pain syndrome: Secondary | ICD-10-CM | POA: Insufficient documentation

## 2021-12-19 DIAGNOSIS — M4316 Spondylolisthesis, lumbar region: Secondary | ICD-10-CM | POA: Diagnosis present

## 2021-12-19 DIAGNOSIS — M47816 Spondylosis without myelopathy or radiculopathy, lumbar region: Secondary | ICD-10-CM | POA: Insufficient documentation

## 2021-12-19 MED ORDER — MIDAZOLAM HCL 5 MG/5ML IJ SOLN
0.5000 mg | Freq: Once | INTRAMUSCULAR | Status: AC
  Administered 2021-12-19: 1 mg via INTRAVENOUS

## 2021-12-19 MED ORDER — DEXAMETHASONE SODIUM PHOSPHATE 10 MG/ML IJ SOLN
INTRAMUSCULAR | Status: AC
  Filled 2021-12-19: qty 1

## 2021-12-19 MED ORDER — DEXAMETHASONE SODIUM PHOSPHATE 10 MG/ML IJ SOLN
10.0000 mg | Freq: Once | INTRAMUSCULAR | Status: AC
  Administered 2021-12-19: 10 mg

## 2021-12-19 MED ORDER — LACTATED RINGERS IV SOLN: Freq: Once | INTRAVENOUS | Status: AC

## 2021-12-19 MED ORDER — FENTANYL CITRATE (PF) 100 MCG/2ML IJ SOLN
25.0000 ug | INTRAMUSCULAR | Status: DC | PRN
  Administered 2021-12-19: 75 ug via INTRAVENOUS

## 2021-12-19 MED ORDER — LIDOCAINE HCL 2 % IJ SOLN
INTRAMUSCULAR | Status: AC
  Filled 2021-12-19: qty 20

## 2021-12-19 MED ORDER — ROPIVACAINE HCL 2 MG/ML IJ SOLN
9.0000 mL | Freq: Once | INTRAMUSCULAR | Status: AC
  Administered 2021-12-19: 9 mL via PERINEURAL

## 2021-12-19 MED ORDER — DEXAMETHASONE SODIUM PHOSPHATE 10 MG/ML IJ SOLN
10.0000 mg | Freq: Once | INTRAMUSCULAR | Status: AC
Start: 2021-12-19 — End: 2021-12-19
  Administered 2021-12-19: 10 mg

## 2021-12-19 MED ORDER — ROPIVACAINE HCL 2 MG/ML IJ SOLN
INTRAMUSCULAR | Status: AC
  Filled 2021-12-19: qty 20

## 2021-12-19 MED ORDER — MIDAZOLAM HCL 5 MG/5ML IJ SOLN
INTRAMUSCULAR | Status: AC
  Filled 2021-12-19: qty 5

## 2021-12-19 MED ORDER — FENTANYL CITRATE (PF) 100 MCG/2ML IJ SOLN
INTRAMUSCULAR | Status: AC
  Filled 2021-12-19: qty 2

## 2021-12-19 MED ORDER — LIDOCAINE HCL 2 % IJ SOLN
20.0000 mL | Freq: Once | INTRAMUSCULAR | Status: AC
  Administered 2021-12-19: 400 mg

## 2021-12-19 NOTE — Progress Notes (Signed)
PROVIDER NOTE: Interpretation of information contained herein should be left to medically-trained personnel. Specific patient instructions are provided elsewhere under "Patient Instructions" section of medical record. This document was created in part using STT-dictation technology, any transcriptional errors that may result from this process are unintentional.  Patient: Tabitha Williams Type: Established DOB: 1975/08/13 MRN: [id number] PCP: [facility]  Service: Procedure DOS: 12/19/2021 Setting: Ambulatory Location: Ambulatory outpatient facility Delivery: Face-to-face Provider: Edward Jolly, MD Specialty: Interventional Pain Management Specialty designation: 09 Location: Outpatient facility Ref. Prov.: Edward Jolly, MD    Primary Reason for Visit: Interventional Pain Management Treatment. CC: Back Pain   Procedure:           Type: Lumbar Facet, Medial Branch Block(s) #3  Laterality: Bilateral  Level: L3, L4, L5, Medial Branch Level(s). Injecting these levels blocks the L3-4 and L5-S1 lumbar facet joints. Imaging: Fluoroscopic guidance Anesthesia: Moderate sedation DOS: 12/19/2021 Performed by: Edward Jolly, MD  Primary Purpose: Diagnostic/Therapeutic Indications: Low back pain severe enough to impact quality of life or function. 1. Lumbar facet arthropathy   2. Lumbar spondylosis   3. Spondylolisthesis at L4-L5 level   4. Pars defect with spondylolisthesis   5. Chronic pain syndrome    NAS-11 Pain score:   Pre-procedure: 4 /10   Post-procedure: 0-No pain (standing)/10     Position / Prep / Materials:  Position: Prone  Prep solution: DuraPrep (Iodine Povacrylex [0.7% available iodine] and Isopropyl Alcohol, 74% w/w) Area Prepped: Posterolateral Lumbosacral Spine (Wide prep: From the lower border of the scapula down to the end of the tailbone and from flank to flank.)  Materials:  Tray: Block Needle(s):  Type: Spinal  Gauge (G): 22  Length:  5-in Qty: 3  Pre-op H&P Assessment:  Tabitha Williams is a 46 y.o. (year old), female patient, seen today for interventional treatment. She  has a past surgical history that includes Cholecystectomy; laparoscopic supracervical hysterectomy (2011); Cesarean section; Gallbladder surgery; and Colonoscopy with propofol (N/A, 06/03/2019). Tabitha Williams has a current medication list which includes the following prescription(s): alprazolam, bac, clonidine, clotrimazole, coenzyme q10, deutetrabenazine, flovent hfa, hydrocortisone, victoza, lisinopril-hydrochlorothiazide, lurasidone, magnesium oxide, nortriptyline, quetiapine fumarate, ubrelvy, venlafaxine, and vitamin d (ergocalciferol), and the following Facility-Administered Medications: fentanyl. Her primarily concern today is the Back Pain  Initial Vital Signs:  Pulse/HCG Rate:  99 ECG Heart Rate: 95 (NSR) Temp: (!) 97.2 F (36.2 C) Resp: 16 BP: 129/89 SpO2: 100 %  BMI: Estimated body mass index is 44.11 kg/m as calculated from the following:   Height as of this encounter: 5\' 4"  (1.626 m).   Weight as of this encounter: 257 lb (116.6 kg).  Risk Assessment: Allergies: Reviewed. She is allergic to morphine and related, sulfa antibiotics, adhesive [tape], and elemental sulfur.  Allergy Precautions: None required Coagulopathies: Reviewed. None identified.  Blood-thinner therapy: None at this time Active Infection(s): Reviewed. None identified. Tabitha Williams is afebrile  Site Confirmation: Tabitha Williams was asked to confirm the procedure and laterality before marking the site Procedure checklist: Completed Consent: Before the procedure and under the influence of no sedative(s), amnesic(s), or anxiolytics, the patient was informed of the treatment options, risks and possible complications. To fulfill our ethical and legal obligations, as recommended by the American Medical Association's Code of Ethics, I have informed the patient of my  clinical impression; the nature and purpose of the treatment or procedure; the risks, benefits, and possible complications of the intervention; the alternatives, including doing nothing; the risk(s) and benefit(s) of  the alternative treatment(s) or procedure(s); and the risk(s) and benefit(s) of doing nothing. The patient was provided information about the general risks and possible complications associated with the procedure. These may include, but are not limited to: failure to achieve desired goals, infection, bleeding, organ or nerve damage, allergic reactions, paralysis, and death. In addition, the patient was informed of those risks and complications associated to Spine-related procedures, such as failure to decrease pain; infection (i.e.: Meningitis, epidural or intraspinal abscess); bleeding (i.e.: epidural hematoma, subarachnoid hemorrhage, or any other type of intraspinal or peri-dural bleeding); organ or nerve damage (i.e.: Any type of peripheral nerve, nerve root, or spinal cord injury) with subsequent damage to sensory, motor, and/or autonomic systems, resulting in permanent pain, numbness, and/or weakness of one or several areas of the body; allergic reactions; (i.e.: anaphylactic reaction); and/or death. Furthermore, the patient was informed of those risks and complications associated with the medications. These include, but are not limited to: allergic reactions (i.e.: anaphylactic or anaphylactoid reaction(s)); adrenal axis suppression; blood sugar elevation that in diabetics may result in ketoacidosis or comma; water retention that in patients with history of congestive heart failure may result in shortness of breath, pulmonary edema, and decompensation with resultant heart failure; weight gain; swelling or edema; medication-induced neural toxicity; particulate matter embolism and blood vessel occlusion with resultant organ, and/or nervous system infarction; and/or aseptic necrosis of one or  more joints. Finally, the patient was informed that Medicine is not an exact science; therefore, there is also the possibility of unforeseen or unpredictable risks and/or possible complications that may result in a catastrophic outcome. The patient indicated having understood very clearly. We have given the patient no guarantees and we have made no promises. Enough time was given to the patient to ask questions, all of which were answered to the patient's satisfaction. [name] has indicated that she wanted to continue with the procedure. Attestation: I, the ordering provider, attest that I have discussed with the patient the benefits, risks, side-effects, alternatives, likelihood of achieving goals, and potential problems during recovery for the procedure that I have provided informed consent. Date  Time: 12/19/2021  7:56 AM  Pre-Procedure Preparation:  Monitoring: As per clinic protocol. Respiration, ETCO2, SpO2, BP, heart rate and rhythm monitor placed and checked for adequate function Safety Precautions: Patient was assessed for positional comfort and pressure points before starting the procedure. Time-out: I initiated and conducted the "Time-out" before starting the procedure, as per protocol. The patient was asked to participate by confirming the accuracy of the "Time Out" information. Verification of the correct person, site, and procedure were performed and confirmed by me, the nursing staff, and the patient. "Time-out" conducted as per Joint Commission's Universal Protocol (UP.01.01.01). Time: [id number]  Description of Procedure:          Laterality: Bilateral. The procedure was performed in identical fashion on both sides. Levels:  L3, L4, L5,  Medial Branch Level(s)  Safety Precautions: Aspiration looking for blood return was conducted prior to all injections. At no point did we inject any substances, as a needle was being advanced. Before injecting, the patient was told to immediately  notify me if she was experiencing any new onset of "ringing in the ears, or metallic taste in the mouth". No attempts were made at seeking any paresthesias. Safe injection practices and needle disposal techniques used. Medications properly checked for expiration dates. SDV (single dose vial) medications used. After the completion of the procedure, all disposable equipment used was discarded in  the proper designated medical waste containers. Local Anesthesia: Protocol guidelines were followed. The patient was positioned over the fluoroscopy table. The area was prepped in the usual manner. The time-out was completed. The target area was identified using fluoroscopy. A 12-in long, straight, sterile hemostat was used with fluoroscopic guidance to locate the targets for each level blocked. Once located, the skin was marked with an approved surgical skin marker. Once all sites were marked, the skin (epidermis, dermis, and hypodermis), as well as deeper tissues (fat, connective tissue and muscle) were infiltrated with a small amount of a short-acting local anesthetic, loaded on a 10cc syringe with a 25G, 1.5-in  Needle. An appropriate amount of time was allowed for local anesthetics to take effect before proceeding to the next step. Local Anesthetic: Lidocaine 2.0% The unused portion of the local anesthetic was discarded in the proper designated containers. Technical explanation of process:  L3 Medial Branch Nerve Block (MBB): The target area for the L3 medial branch is at the junction of the postero-lateral aspect of the superior articular process and the superior, posterior, and medial edge of the transverse process of L4. Under fluoroscopic guidance, a Quincke needle was inserted until contact was made with os over the superior postero-lateral aspect of the pedicular shadow (target area). After negative aspiration for blood, 51mL of the nerve block solution was injected without difficulty or complication. The needle  was removed intact. L4 Medial Branch Nerve Block (MBB): The target area for the L4 medial branch is at the junction of the postero-lateral aspect of the superior articular process and the superior, posterior, and medial edge of the transverse process of L5. Under fluoroscopic guidance, a Quincke needle was inserted until contact was made with os over the superior postero-lateral aspect of the pedicular shadow (target area). After negative aspiration for blood, 76mL of the nerve block solution was injected without difficulty or complication. The needle was removed intact. L5 Medial Branch Nerve Block (MBB): The target area for the L5 medial branch is at the junction of the postero-lateral aspect of the superior articular process and the superior, posterior, and medial edge of the sacral ala. Under fluoroscopic guidance, a Quincke needle was inserted until contact was made with os over the superior postero-lateral aspect of the pedicular shadow (target area). After negative aspiration for blood, 50mL of the nerve block solution was injected without difficulty or complication. The needle was removed intact. Once the entire procedure was completed, the treated area was cleaned, making sure to leave some of the prepping solution back to take advantage of its long term bactericidal properties.  12 cc solution made of 10 cc of 0.2% ropivacaine, 2 cc of Decadron 10 mg/cc. 2 cc injected at each level above        Illustration of the posterior view of the lumbar spine and the posterior neural structures. Laminae of L2 through S1 are labeled. DPRL5, dorsal primary ramus of L5; DPRS1, dorsal primary ramus of S1; DPR3, dorsal primary ramus of L3; FJ, facet (zygapophyseal) joint L3-L4; I, inferior articular process of L4; LB1, lateral branch of dorsal primary ramus of L1; IAB, inferior articular branches from L3 medial branch (supplies L4-L5 facet joint); IBP, intermediate branch plexus; MB3, medial branch of dorsal  primary ramus of L3; NR3, third lumbar nerve root; S, superior articular process of L5; SAB, superior articular branches from L4 (supplies L4-5 facet joint also); TP3, transverse process of L3.  Vitals:   12/19/21 1610 12/19/21 9604 12/19/21 0902 12/19/21 5409  BP: (!) 119/97 131/86 (!) 114/93 (!) 116/90  Pulse:   85 85  Resp: (!) 21 (!) 21 16 16   Temp:      TempSrc:      SpO2: 98% 96% 96% 97%  Weight:      Height:         Start Time: 0834 hrs. End Time: 0845 hrs.  Imaging Guidance (Spinal):          Type of Imaging Technique: Fluoroscopy Guidance (Spinal) Indication(s): Assistance in needle guidance and placement for procedures requiring needle placement in or near specific anatomical locations not easily accessible without such assistance. Exposure Time: Please see nurses notes. Contrast: None used. Fluoroscopic Guidance: I was personally present during the use of fluoroscopy. "Tunnel Vision Technique" used to obtain the best possible view of the target area. Parallax error corrected before commencing the procedure. "Direction-depth-direction" technique used to introduce the needle under continuous pulsed fluoroscopy. Once target was reached, antero-posterior, oblique, and lateral fluoroscopic projection used confirm needle placement in all planes. Images permanently stored in EMR. Interpretation: No contrast injected. I personally interpreted the imaging intraoperatively. Adequate needle placement confirmed in multiple planes. Permanent images saved into the patient's record.  Post-operative Assessment:  Post-procedure Vital Signs:  Pulse/HCG Rate:  85 87 Temp: (!) 97.2 F (36.2 C) Resp:  16 BP:  (!) 116/90 SpO2: 97 %  EBL: None  Complications: No immediate post-treatment complications observed by team, or reported by patient.  Note: The patient tolerated the entire procedure well. A repeat set of vitals were taken after the procedure and the patient was kept under  observation following institutional policy, for this type of procedure. Post-procedural neurological assessment was performed, showing return to baseline, prior to discharge. The patient was provided with post-procedure discharge instructions, including a section on how to identify potential problems. Should any problems arise concerning this procedure, the patient was given instructions to immediately contact , at any time, without hesitation. In any case, we plan to contact the patient by telephone for a follow-up status report regarding this interventional procedure.  Comments:  No additional relevant information.  Plan of Care  Orders:  Orders Placed This Encounter  Procedures   DG PAIN CL[facility] C-ARM 1-60 MIN NO REPORT    Intraoperative interpretation by procedural physician at [facility].    Standing Status:   Standing    Number of Occurrences:   1    Order Specific Question:   Reason for exam:    Answer:   Assistance in needle guidance and placement for procedures requiring needle placement in or near specific anatomical locations not easily accessible without such assistance.    Medications ordered for procedure: Meds ordered this encounter  Medications   lidocaine (XYLOCAINE) 2 % (with pres) injection 400 mg   lactated ringers infusion   midazolam (VERSED) 5 MG/5ML injection 0.5-2 mg    Make sure Flumazenil is available in the pyxis when using this medication. If oversedation occurs, administer 0.2 mg IV over 15 sec. If after 45 sec no response, administer 0.2 mg again over 1 min; may repeat at 1 min intervals; not to exceed 4 doses (1 mg)   fentaNYL (SUBLIMAZE) injection 25-50 mcg    Make sure Narcan is available in the pyxis when using this medication. In the event of respiratory depression (RR< 8/min): Titrate NARCAN (naloxone) in increments of 0.1 to 0.2 mg IV at 2-3 minute intervals, until desired degree of reversal.   dexamethasone (DECADRON) injection 10  mg    dexamethasone (DECADRON) injection 10 mg   ropivacaine (PF) 2 mg/mL (0.2%) (NAROPIN) injection 9 mL   ropivacaine (PF) 2 mg/mL (0.2%) (NAROPIN) injection 9 mL   Medications administered: We administered lidocaine, lactated ringers, midazolam, fentaNYL, dexamethasone, dexamethasone, ropivacaine (PF) 2 mg/mL (0.2%), and ropivacaine (PF) 2 mg/mL (0.2%).  See the medical record for exact dosing, route, and time of administration.  Follow-up plan:   Return in about 8 weeks (around 02/13/2022) for Post Procedure Evaluation, virtual.      Recent Visits Date Type Provider Dept  12/05/21 Office Visit Edward Jolly, MD [facility]  10/26/21 Office Visit Edward Jolly, MD [facility]  09/28/21 Office Visit Edward Jolly, MD [facility]  Showing recent visits within past 90 days and meeting all other requirements Today's Visits Date Type Provider Dept  12/19/21 Procedure visit Edward Jolly, MD [facility]  Showing today's visits and meeting all other requirements Future Appointments Date Type Provider Dept  02/13/22 Appointment Edward Jolly, MD [facility]  Showing future appointments within next 90 days and meeting all other requirements  Disposition: Discharge home  Discharge (Date  Time): 12/19/2021; 0912 hrs.   Primary Care Physician: [facility] Location: [facility] Note by: Edward Jolly, MD Date: 12/19/2021; Time: 10:30 AM  Disclaimer:  Medicine is not an exact science. The only guarantee in medicine is that nothing is guaranteed. It is important to note that the decision to proceed with this intervention was based on the information collected from the patient. The Data and conclusions were drawn from the patient's questionnaire, the interview, and the physical examination. Because the information was provided in large part by the patient, it cannot be guaranteed that it has not  been purposely or unconsciously manipulated. Every effort has been made to obtain as much relevant data as possible for this evaluation. It is important to note that the conclusions that lead to this procedure are derived in large part from the available data. Always take into account that the treatment will also be dependent on availability of resources and existing treatment guidelines, considered by other Pain Management Practitioners as being common knowledge and practice, at the time of the intervention. For Medico-Legal purposes, it is also important to point out that variation in procedural techniques and pharmacological choices are the acceptable norm. The indications, contraindications, technique, and results of the above procedure should only be interpreted and judged by a Board-Certified Interventional Pain Specialist with extensive familiarity and expertise in the same exact procedure and technique.

## 2021-12-19 NOTE — Patient Instructions (Signed)

## 2021-12-19 NOTE — Progress Notes (Signed)
Safety precautions to be maintained throughout the outpatient stay will include: orient to surroundings, keep bed in low position, maintain call bell within reach at all times, provide assistance with transfer out of bed and ambulation.  

## 2021-12-20 ENCOUNTER — Telehealth: Payer: Self-pay | Admitting: *Deleted

## 2021-12-20 NOTE — Telephone Encounter (Signed)
Post procedure call;  patient reports that she is doing well.  Still having some pain is going to use heating pad today.Marland Kitchen  otherwise okay

## 2022-01-27 ENCOUNTER — Other Ambulatory Visit: Payer: Self-pay | Admitting: Gastroenterology

## 2022-02-13 ENCOUNTER — Telehealth: Payer: Medicare HMO | Admitting: Student in an Organized Health Care Education/Training Program

## 2022-02-14 ENCOUNTER — Encounter: Payer: Self-pay | Admitting: Student in an Organized Health Care Education/Training Program

## 2022-02-14 ENCOUNTER — Ambulatory Visit
Payer: Medicare HMO | Attending: Student in an Organized Health Care Education/Training Program | Admitting: Student in an Organized Health Care Education/Training Program

## 2022-02-14 DIAGNOSIS — M5416 Radiculopathy, lumbar region: Secondary | ICD-10-CM | POA: Diagnosis not present

## 2022-02-14 DIAGNOSIS — M4310 Spondylolisthesis, site unspecified: Secondary | ICD-10-CM

## 2022-02-14 DIAGNOSIS — M4316 Spondylolisthesis, lumbar region: Secondary | ICD-10-CM | POA: Diagnosis not present

## 2022-02-14 DIAGNOSIS — M48061 Spinal stenosis, lumbar region without neurogenic claudication: Secondary | ICD-10-CM

## 2022-02-14 DIAGNOSIS — M47816 Spondylosis without myelopathy or radiculopathy, lumbar region: Secondary | ICD-10-CM | POA: Diagnosis not present

## 2022-02-14 DIAGNOSIS — G894 Chronic pain syndrome: Secondary | ICD-10-CM

## 2022-02-14 NOTE — Progress Notes (Signed)
Patient: Tabitha Williams  Service Category: E/M  Provider: [name], MD  DOB: 24-Sep-1975  DOS: 02/14/2022  Location: Office  MRN: [id number]  Setting: Ambulatory outpatient  Referring Provider: [facility] [place]  Type: Established Patient  Specialty: Interventional Pain Management  PCP: [facility] [place]  Location: Remote location  Delivery: TeleHealth     Virtual Encounter - Pain Management PROVIDER NOTE: Information contained herein reflects review and annotations entered in association with encounter. Interpretation of such information and data should be left to medically-trained personnel. Information provided to patient can be located elsewhere in the medical record under "Patient Instructions". Document created using STT-dictation technology, any transcriptional errors that may result from process are unintentional.    Contact & Pharmacy Preferred: [phone] Home: [phone] (home) Mobile: [phone] (mobile) E-mail: [email or web address] .[place], [place] - [place] [address] Phone: [phone] Fax: [phone]   Pre-screening  [name] offered "in-person" vs "virtual" encounter. She indicated preferring virtual for this encounter.   Reason COVID-19*  Social distancing based on CDC and AMA recommendations.   I contacted ALZINA GOLDA on 02/14/2022 via telephone.      I clearly identified myself as [name], MD. I verified that I was speaking with the correct person using two identifiers (Name: [name], and date of birth: 03-10-1976).  Consent I sought verbal advanced consent from Tabitha Williams for virtual visit interactions. I informed [name] of possible security and privacy concerns, risks, and limitations associated with providing "not-in-person" medical evaluation and management services. I also informed [name] of the  availability of "in-person" appointments. Finally, I informed her that there would be a charge for the virtual visit and that she could be  personally, fully or partially, financially responsible for it. Ms. Wulff expressed understanding and agreed to proceed.   Historic Elements   [name] is a 46 y.o. year old, female patient evaluated today after our last contact on 12/19/2021. Ms. Olveda  has a past medical history of Bipolar 1 disorder with moderate mania ([place]), Endometriosis, Mental health problem, and Rectal bleeding. She also  has a past surgical history that includes Cholecystectomy; laparoscopic supracervical hysterectomy (2011); Cesarean section; Gallbladder surgery; and Colonoscopy with propofol (N/A, 06/03/2019). Ms. Schicker has a current medication list which includes the following prescription(s): alprazolam, bac, clonidine, clotrimazole, coenzyme q10, deutetrabenazine, flovent hfa, hydrocortisone, victoza, lisinopril-hydrochlorothiazide, lurasidone, magnesium oxide, nortriptyline, quetiapine fumarate, ubrelvy, venlafaxine, and vitamin d (ergocalciferol). She  reports that she has been smoking cigarettes. She has been smoking an average of 1 pack per day. She has never used smokeless tobacco. She reports that she does not currently use alcohol. She reports current drug use. Frequency: 7.00 times per week. Drug: Marijuana. Ms. Jiron is allergic to morphine and related, sulfa antibiotics, adhesive [tape], and elemental sulfur.   HPI  Today, she is being contacted for a post-procedure assessment.   Post-procedure evaluation   Type: Lumbar Facet, Medial Branch Block(s) #3  Laterality: Bilateral  Level: L3, L4, L5, Medial Branch Level(s). Injecting these levels blocks the L3-4 and L5-S1 lumbar facet joints. Imaging: Fluoroscopic guidance Anesthesia: Moderate sedation DOS: 12/19/2021 Performed by: [name], MD  Primary Purpose:  Diagnostic/Therapeutic Indications: Low back pain severe enough to impact quality of life or function. 1. Lumbar facet arthropathy   2. Lumbar spondylosis   3. Spondylolisthesis at L4-L5 level   4. Pars defect with spondylolisthesis  5. Chronic pain syndrome    NAS-11 Pain score:   Pre-procedure: 4 /10   Post-procedure: 0-No pain (standing)/10      Effectiveness:  Initial hour after procedure: 65 %  Subsequent 4-6 hours post-procedure: 65 %  Analgesia past initial 6 hours: 0 % (only got 1.5 days worth of pain relief)  Ongoing improvement:  Analgesic:  0%  Laboratory Chemistry Profile   Renal Lab Results  Component Value Date   BUN 14 05/20/2019   BUN 14 05/20/2019   CREATININE 1.48 (H) 05/20/2019   CREATININE 1.48 (H) 05/20/2019   BCR 9 05/20/2019   BCR 9 05/20/2019   GFRAA 50 (L) 05/20/2019   GFRAA 50 (L) 05/20/2019   GFRNONAA 43 (L) 05/20/2019   GFRNONAA 43 (L) 05/20/2019    Hepatic Lab Results  Component Value Date   AST 12 01/23/2020   ALT 12 01/23/2020   ALBUMIN 4.6 01/23/2020   ALKPHOS 83 01/23/2020    Electrolytes Lab Results  Component Value Date   NA 140 05/20/2019   NA 140 05/20/2019   K 4.5 05/20/2019   K 4.5 05/20/2019   CL 102 05/20/2019   CL 102 05/20/2019   CALCIUM 10.1 05/20/2019   CALCIUM 10.1 05/20/2019    Bone Lab Results  Component Value Date   VD125OH2TOT 36 05/20/2019   [id number] 32 05/20/2019   [id number] <10 05/20/2019    Inflammation (CRP: Acute Phase) (ESR: Chronic Phase) Lab Results  Component Value Date   CRP 3 05/20/2019         Note: Above Lab results reviewed.  Imaging  DG PAIN CLINIC C-ARM 1-60 MIN NO REPORT Fluoro was used, but no Radiologist interpretation will be provided.  Please refer to "NOTES" tab for provider progress note.  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 Diagnoses of Lumbar radicular pain, Lumbar facet arthropathy, Lumbar spondylosis,  Spondylolisthesis at L4-L5 level, Pars defect with spondylolisthesis, and Chronic pain syndrome were also pertinent to this visit.  Plan of Care  Increased low back pain with radiation into bilateral legs in a posterior lateral distribution stopping usually at her knees. She has done physical therapy in the past and is also trying to do home stretching and strengthening exercises for her low back.  She states that this is not helping out with her radiating leg pain.  Orders:  Orders Placed This Encounter  Procedures   Lumbar Epidural Injection    Standing Status:   Future    Standing Expiration Date:   05/17/2022    Scheduling Instructions:     Procedure: Interlaminar Lumbar Epidural Steroid injection (LESI)            Laterality: Midline     Sedation: IV Versed     Timeframe: ASAA    Order Specific Question:   Where will this procedure be performed?    Answer:   [facility]   Follow-up plan:   Return in about 2 weeks (around 02/28/2022) for L4/5 ESI , in clinic IV Versed.     B/L L3,4,5 Fct block #1: 05/25/21; #2 06/22/21: L3,4,5 RFA: R 08/15/21, L:08-01-21      Recent Visits Date Type Provider Dept  12/19/21 Procedure visit [name], MD [facility]  12/05/21 Office Visit [name], MD [facility]  Showing recent visits within past 90 days and meeting all other requirements Today's Visits Date Type Provider Dept  02/14/22 Office Visit [name], MD [facility]  Showing today's visits and meeting all other requirements Future Appointments No visits were found meeting these conditions. Showing future appointments within next 90 days and meeting all other requirements  I discussed the assessment and treatment plan with the patient. The patient was provided an opportunity to ask questions and all were answered. The patient agreed with the plan and demonstrated an understanding of the instructions.  Patient advised to call back or  seek an in-person evaluation if the symptoms or condition worsens.  Duration of encounter: 37mnutes.  Note by: BGillis Santa MD Date: 02/14/2022; Time: 10:18 AM

## 2022-02-27 ENCOUNTER — Telehealth: Payer: Self-pay

## 2022-02-27 NOTE — Telephone Encounter (Signed)
Insurance denied facets. See criteria below. ..   "Your records show that this treatment is for your back. Based on Medicare Local Coverage  Determination (LCD): Epidural Steroid Injections for Pain Management (D97416), we cannot  approve this request because the following are needed: An imaging study that shows narrowing of the spine due to a ruptured spine disc, a bone spur,  and/or severe wear and tear on the discs in the spine."

## 2022-05-05 ENCOUNTER — Other Ambulatory Visit: Payer: Self-pay | Admitting: Family Medicine

## 2022-05-05 DIAGNOSIS — Z1231 Encounter for screening mammogram for malignant neoplasm of breast: Secondary | ICD-10-CM

## 2022-05-31 ENCOUNTER — Ambulatory Visit
Admission: RE | Admit: 2022-05-31 | Discharge: 2022-05-31 | Disposition: A | Discharge status: Home / Self Care | Payer: Medicare HMO | Source: Ambulatory Visit | Attending: Family Medicine | Admitting: Family Medicine

## 2022-05-31 DIAGNOSIS — Z1231 Encounter for screening mammogram for malignant neoplasm of breast: Secondary | ICD-10-CM | POA: Insufficient documentation

## 2022-06-02 ENCOUNTER — Encounter: Payer: Self-pay | Admitting: Family Medicine

## 2022-06-08 ENCOUNTER — Other Ambulatory Visit: Payer: Self-pay | Admitting: Family Medicine

## 2022-06-08 DIAGNOSIS — R928 Other abnormal and inconclusive findings on diagnostic imaging of breast: Secondary | ICD-10-CM

## 2022-06-13 ENCOUNTER — Ambulatory Visit
Admission: RE | Admit: 2022-06-13 | Discharge: 2022-06-13 | Disposition: A | Discharge status: Home / Self Care | Payer: Medicare HMO | Source: Ambulatory Visit | Attending: Family Medicine | Admitting: Family Medicine

## 2022-06-13 DIAGNOSIS — R928 Other abnormal and inconclusive findings on diagnostic imaging of breast: Secondary | ICD-10-CM | POA: Diagnosis not present

## 2022-06-19 ENCOUNTER — Other Ambulatory Visit: Payer: Self-pay | Admitting: Family Medicine

## 2022-06-19 DIAGNOSIS — R928 Other abnormal and inconclusive findings on diagnostic imaging of breast: Secondary | ICD-10-CM

## 2022-06-19 DIAGNOSIS — N6489 Other specified disorders of breast: Secondary | ICD-10-CM

## 2022-06-22 ENCOUNTER — Ambulatory Visit
Admission: RE | Admit: 2022-06-22 | Discharge: 2022-06-22 | Disposition: A | Discharge status: Home / Self Care | Payer: Medicare HMO | Source: Ambulatory Visit | Attending: Family Medicine | Admitting: Family Medicine

## 2022-06-22 DIAGNOSIS — N6489 Other specified disorders of breast: Secondary | ICD-10-CM

## 2022-06-22 DIAGNOSIS — R928 Other abnormal and inconclusive findings on diagnostic imaging of breast: Secondary | ICD-10-CM | POA: Insufficient documentation

## 2022-06-22 HISTORY — PX: BREAST BIOPSY: SHX20

## 2022-06-22 MED ORDER — LIDOCAINE HCL (PF) 1 % IJ SOLN
15.0000 mL | Freq: Once | INTRAMUSCULAR | Status: AC
  Administered 2022-06-22: 15 mL
  Filled 2022-06-22: qty 15

## 2022-06-22 MED ORDER — LIDOCAINE-EPINEPHRINE 1 %-1:100000 IJ SOLN
10.0000 mL | Freq: Once | INTRAMUSCULAR | Status: AC
  Administered 2022-06-22: 10 mL
  Filled 2022-06-22: qty 10

## 2022-06-23 LAB — SURGICAL PATHOLOGY

## 2022-07-27 ENCOUNTER — Other Ambulatory Visit: Payer: Self-pay | Admitting: Family Medicine

## 2022-07-27 DIAGNOSIS — R928 Other abnormal and inconclusive findings on diagnostic imaging of breast: Secondary | ICD-10-CM

## 2022-10-19 IMAGING — MR MR HEAD W/O CM
12 series · 47 of 48 positions shown · non-contrast
Comparison: Head CT 03/22/2020.

CLINICAL DATA: Headache disorder. Nausea. Numbness and tingling.
B12 deficiency. Additional history provided by scanning
technologist: Patient reports daily temporal headaches for 1 year.

EXAM:
MRI HEAD WITHOUT CONTRAST
TECHNIQUE: Multiplanar, multiecho pulse sequences of the brain and surrounding
structures were obtained without intravenous contrast.

[Series 5: ax dwi_tracew · axial · 3.0mm · 0.65mm/px · z∈[-54,+100]mm · 4 of 48 slices shown]
[im 1/48]
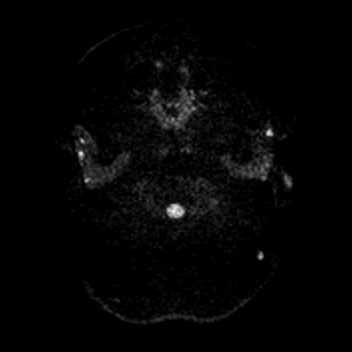
[im 16/48]
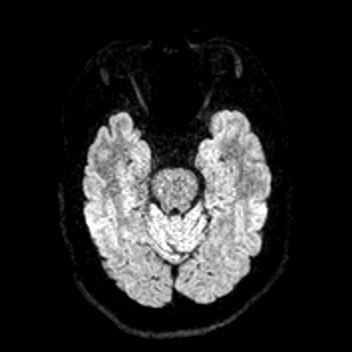
[im 32/48]
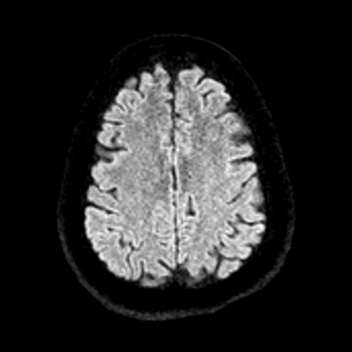
[im 48/48]
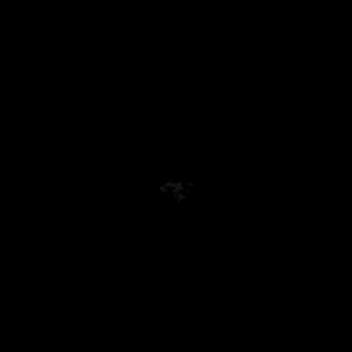

[Series 6: ax dwi_adc · axial · 3.0mm · 0.65mm/px · z∈[-54,+96]mm · 3 of 47 slices shown]
[im 1/47]
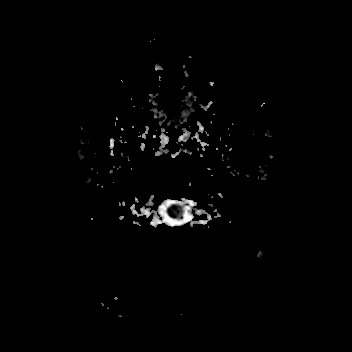
[im 24/47]
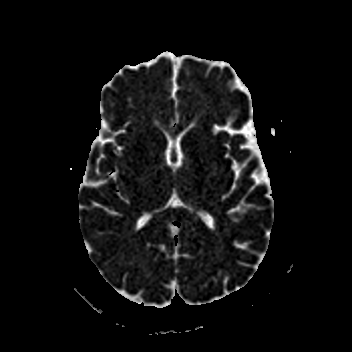
[im 47/47]
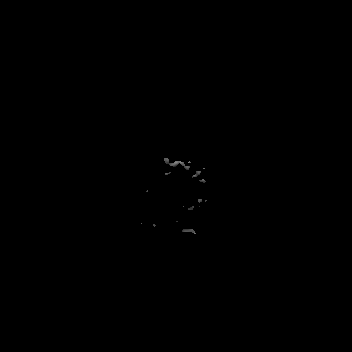

[Series 7: cor dwi_tracew · coronal · 5.0mm · 0.60mm/px · 3 of 38 slices shown]
[im 1/38]
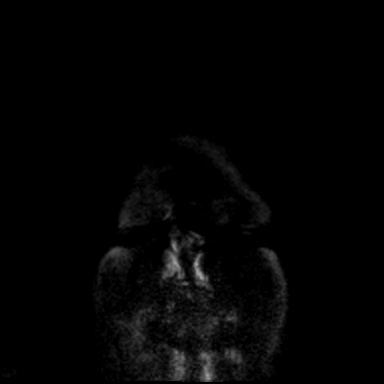
[im 19/38]
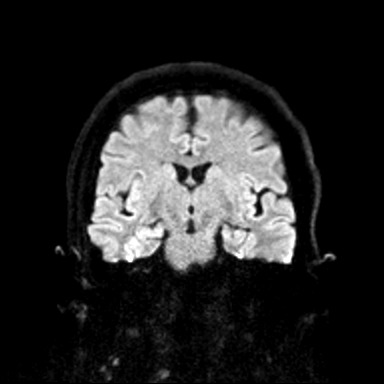
[im 38/38]
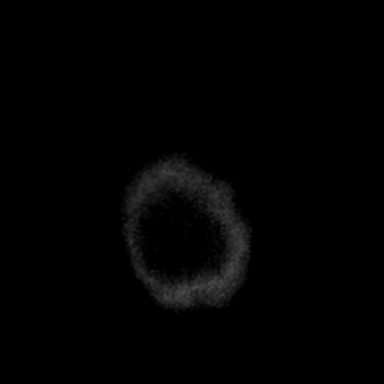

[Series 8: cor dwi_adc · coronal · 5.0mm · 0.60mm/px · 3 of 38 slices shown]
[im 1/38]
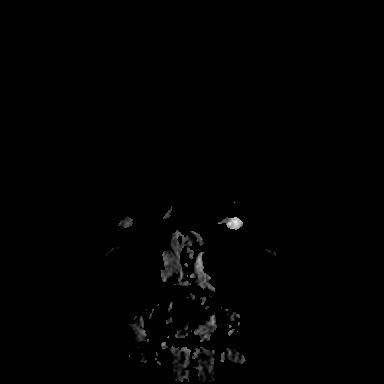
[im 19/38]
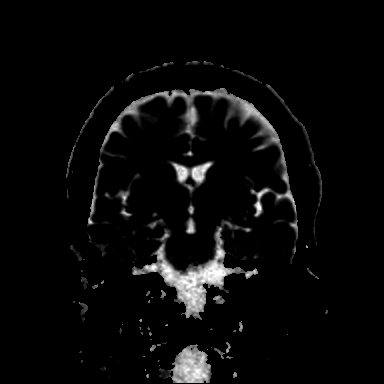
[im 38/38]
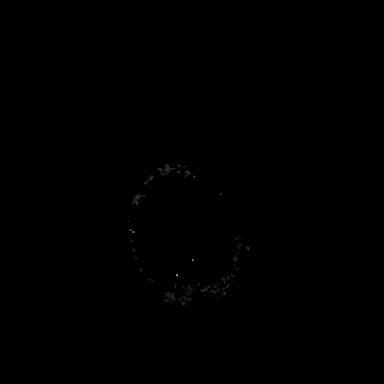

[Series 9: T1 · sagittal · 5.0mm · 0.62mm/px · 2 of 21 slices shown (1 of 2)]
[im 1/21]
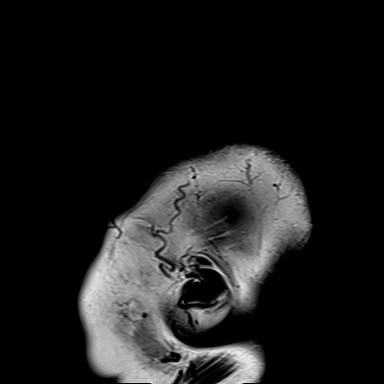
[im 21/21]
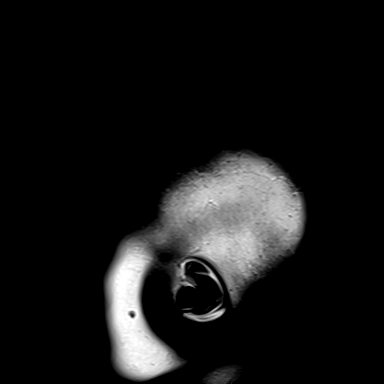

[Series 10: T2 · axial · 5.0mm · 0.58mm/px · z∈[-60,+94]mm · 2 of 27 slices shown (1 of 2)]
[im 1/27]
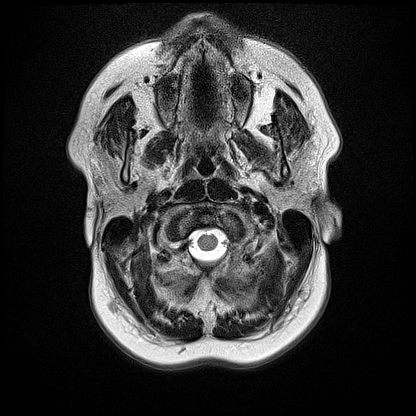
[im 27/27]
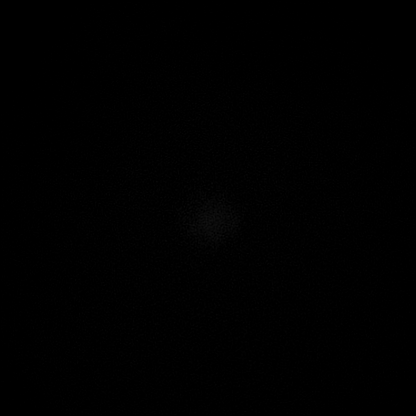

[Series 11: mag_images · axial · 3.0mm · 0.90mm/px · z∈[-69,+106]mm · 4 of 60 slices shown]
[im 1/60]
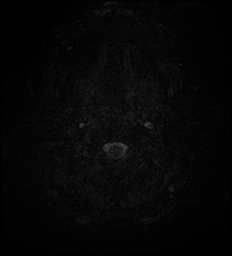
[im 20/60]
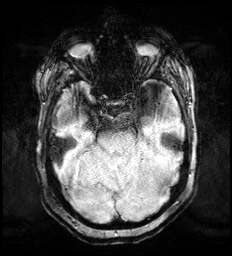
[im 40/60]
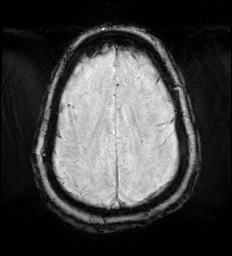
[im 60/60]
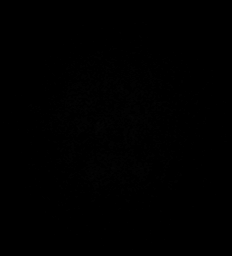

[Series 12: pha_images · axial · 3.0mm · 0.90mm/px · z∈[-69,+106]mm · 4 of 59 slices shown]
[im 1/59]
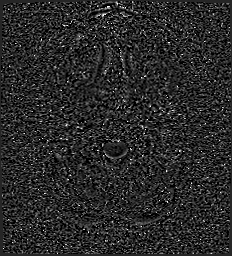
[im 20/59]
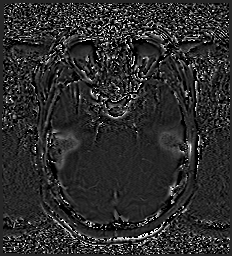
[im 39/59]
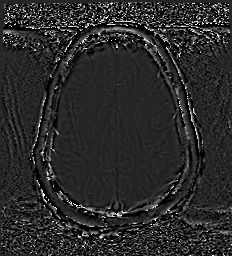
[im 59/59]
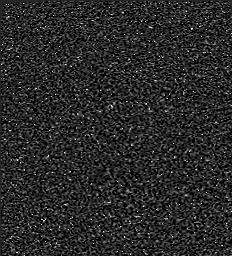

[Series 13: swi_images · axial · 3.0mm · 0.90mm/px · z∈[-69,+106]mm · 4 of 60 slices shown]
[im 1/60]
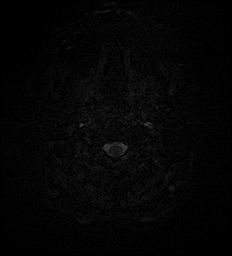
[im 20/60]
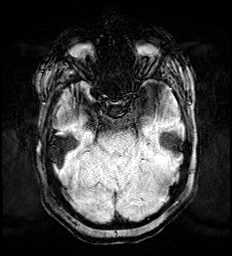
[im 40/60]
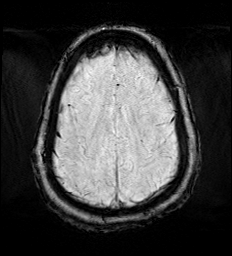
[im 60/60]
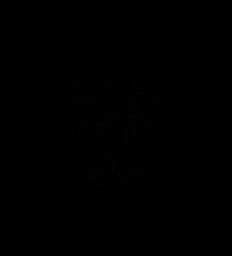

[Series 15: FLAIR · axial · 3.0mm · 0.53mm/px · z∈[-61,+99]mm · 4 of 55 slices shown]
[im 1/55]
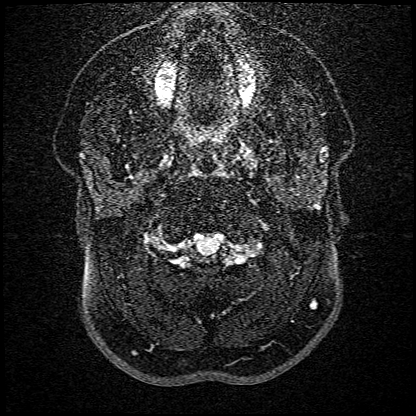
[im 19/55]
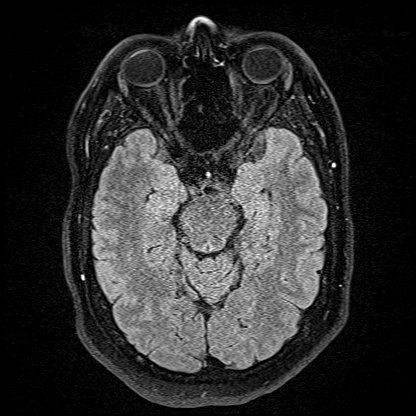
[im 37/55]
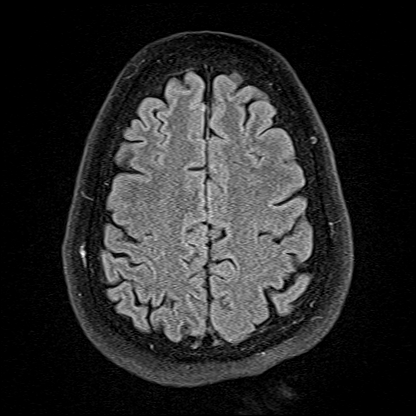
[im 55/55]
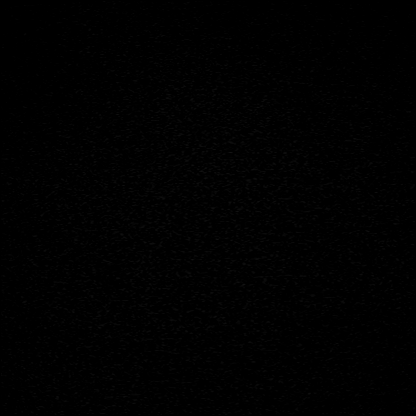

[Series 16: T1 · axial · 1.0mm · 0.98mm/px · z∈[-65,+108]mm · 12 of 175 slices shown (2 of 2)]
[im 1/175]
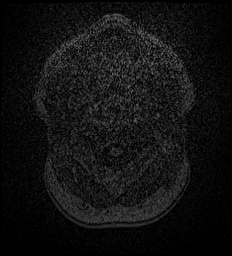
[im 15/175]
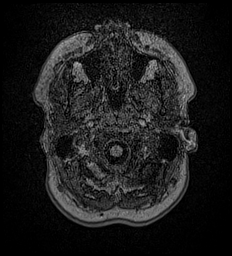
[im 30/175]
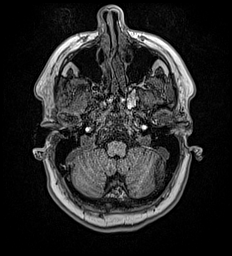
[im 44/175]
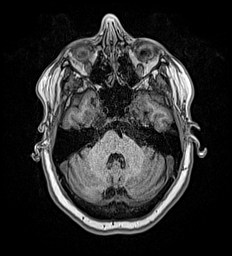
[im 59/175]
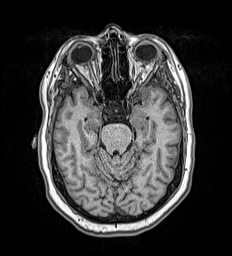
[im 73/175]
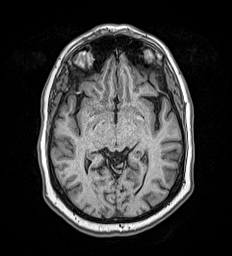
[im 88/175]
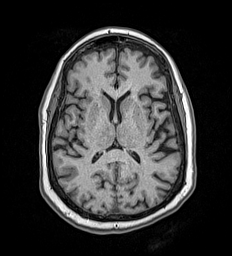
[im 102/175]
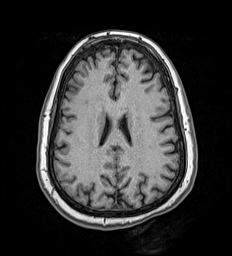
[im 117/175]
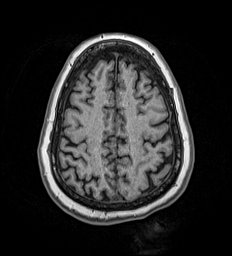
[im 131/175]
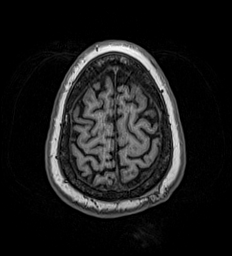
[im 146/175]
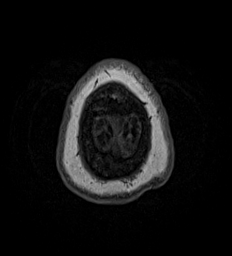
[im 175/175]
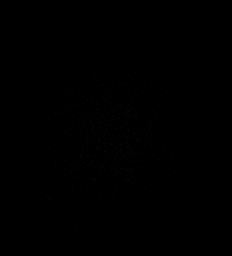

[Series 17: T2 · coronal · 5.0mm · 0.57mm/px · 2 of 29 slices shown (2 of 2)]
[im 1/29]
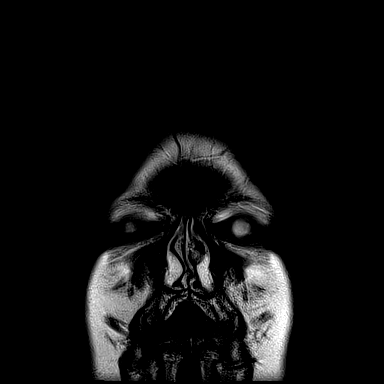
[im 29/29]
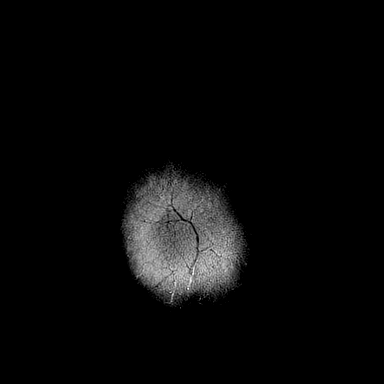

[47 of 48 positions shown; findings below may reference images not displayed]

FINDINGS: Brain:

Cerebral volume is normal for age.

No cortical encephalomalacia is identified. No significant cerebral
white matter disease.

There is no acute infarct.

No evidence of an intracranial mass.

No chronic intracranial blood products.

No extra-axial fluid collection.

No midline shift.

Vascular: Maintained flow voids within the proximal large arterial
vessels.

Skull and upper cervical spine: No focal suspicious marrow lesion.

Sinuses/Orbits: Visualized orbits show no acute finding. No
significant paranasal sinus disease.
IMPRESSION: Unremarkable MRI appearance of the brain for age. No evidence of
acute intracranial abnormality.

## 2022-11-30 DIAGNOSIS — D72829 Elevated white blood cell count, unspecified: Secondary | ICD-10-CM | POA: Insufficient documentation

## 2022-12-22 ENCOUNTER — Ambulatory Visit: Admission: RE | Admit: 2022-12-22 | Payer: Medicare HMO | Source: Ambulatory Visit

## 2022-12-22 ENCOUNTER — Ambulatory Visit
Admission: RE | Admit: 2022-12-22 | Discharge: 2022-12-22 | Disposition: A | Discharge status: Home / Self Care | Payer: Medicare HMO | Source: Ambulatory Visit | Attending: Family Medicine | Admitting: Family Medicine

## 2022-12-22 DIAGNOSIS — R928 Other abnormal and inconclusive findings on diagnostic imaging of breast: Secondary | ICD-10-CM

## 2023-01-18 ENCOUNTER — Other Ambulatory Visit: Payer: Self-pay | Admitting: Physician Assistant

## 2023-01-18 DIAGNOSIS — R928 Other abnormal and inconclusive findings on diagnostic imaging of breast: Secondary | ICD-10-CM

## 2023-02-14 ENCOUNTER — Ambulatory Visit
Admission: RE | Admit: 2023-02-14 | Discharge: 2023-02-14 | Disposition: A | Discharge status: Home / Self Care | Payer: Medicare HMO | Source: Ambulatory Visit | Attending: Physician Assistant | Admitting: Physician Assistant

## 2023-02-14 DIAGNOSIS — R928 Other abnormal and inconclusive findings on diagnostic imaging of breast: Secondary | ICD-10-CM | POA: Diagnosis present

## 2023-02-21 ENCOUNTER — Ambulatory Visit (INDEPENDENT_AMBULATORY_CARE_PROVIDER_SITE_OTHER): Payer: Medicare HMO | Admitting: Dermatology

## 2023-02-21 DIAGNOSIS — L304 Erythema intertrigo: Secondary | ICD-10-CM

## 2023-02-21 DIAGNOSIS — L720 Epidermal cyst: Secondary | ICD-10-CM

## 2023-02-21 DIAGNOSIS — B372 Candidiasis of skin and nail: Secondary | ICD-10-CM

## 2023-02-21 DIAGNOSIS — D229 Melanocytic nevi, unspecified: Secondary | ICD-10-CM

## 2023-02-21 MED ORDER — HYDROCORTISONE 2.5 % EX CREA: TOPICAL_CREAM | CUTANEOUS | 2 refills | Status: AC

## 2023-02-21 NOTE — Progress Notes (Unsigned)
   New Patient Visit   Subjective  Tabitha Williams is a 47 y.o. female who presents for the following: Skin tag, Rash, Nevus  The patient has spots, moles and lesions to be evaluated, some may be new or changing and the patient may have concern these could be cancer.  Patient has a skin tag on right side of face and a nevus on abdomen.  C/o rash under breast, not active today. PCP says it is yeast. She was prescribed Clotrimazole to use when flared. It will clear but come back. Not sure is this is the right treatment  The following portions of the chart were reviewed this encounter and updated as appropriate: medications, allergies, medical history  Review of Systems:  No other skin or systemic complaints except as noted in HPI or Assessment and Plan.  Objective  Well appearing patient in no apparent distress; mood and affect are within normal limits.    A focused examination was performed of the following areas: Face, chest, abdomen  Relevant exam findings are noted in the Assessment and Plan.    Assessment & Plan   Milia - tiny firm white papules - type of cyst - benign - sometimes these will clear with nightly OTC adapalene/Differin 0.1% gel or retinol. - may be extracted if symptomatic - observe  MELANOCYTIC NEVI Exam: Tan-brown and/or pink-flesh-colored symmetric macules and papules  Treatment Plan: Benign appearing on exam today. Recommend observation. Call clinic for new or changing moles. Recommend daily use of broad spectrum spf 30+ sunscreen to sun-exposed areas.   INTERTRIGO Exam Hx of erythematous macerated patches  Chronic condition with duration or expected duration over one year. Currently well-controlled.   Intertrigo is a chronic recurrent rash that occurs in skin fold areas that may be associated with friction; heat; moisture; yeast; fungus; and bacteria.  It is exacerbated by increased movement / activity; sweating; and higher atmospheric  temperature.  Treatment Plan Start HC 2.5% cream bid until clear as needed for rash. Avoid applying to face, groin, and axilla. Use as directed. Long-term use can cause thinning of the skin.    Return if symptoms worsen or fail to improve.  Anise Salvo, RMA, am acting as scribe for Elie Goody, MD .   Documentation: I have reviewed the above documentation for accuracy and completeness, and I agree with the above.  Elie Goody, MD

## 2023-02-21 NOTE — Patient Instructions (Signed)

## 2023-02-22 ENCOUNTER — Encounter: Payer: Self-pay | Admitting: Dermatology

## 2023-05-04 ENCOUNTER — Other Ambulatory Visit: Payer: Self-pay | Admitting: Physician Assistant

## 2023-05-04 DIAGNOSIS — Z1231 Encounter for screening mammogram for malignant neoplasm of breast: Secondary | ICD-10-CM

## 2023-06-05 ENCOUNTER — Ambulatory Visit
Admission: RE | Admit: 2023-06-05 | Discharge: 2023-06-05 | Disposition: A | Discharge status: Home / Self Care | Payer: Medicare HMO | Source: Ambulatory Visit | Attending: Physician Assistant | Admitting: Physician Assistant

## 2023-06-05 DIAGNOSIS — Z1231 Encounter for screening mammogram for malignant neoplasm of breast: Secondary | ICD-10-CM | POA: Insufficient documentation

## 2023-10-17 ENCOUNTER — Other Ambulatory Visit: Payer: Self-pay | Admitting: Physician Assistant

## 2023-10-17 DIAGNOSIS — R202 Paresthesia of skin: Secondary | ICD-10-CM

## 2023-10-17 DIAGNOSIS — R29818 Other symptoms and signs involving the nervous system: Secondary | ICD-10-CM

## 2023-10-17 DIAGNOSIS — M545 Low back pain, unspecified: Secondary | ICD-10-CM

## 2023-10-17 DIAGNOSIS — R262 Difficulty in walking, not elsewhere classified: Secondary | ICD-10-CM

## 2023-10-17 DIAGNOSIS — R2689 Other abnormalities of gait and mobility: Secondary | ICD-10-CM

## 2023-10-18 ENCOUNTER — Encounter: Payer: Self-pay | Admitting: Physician Assistant

## 2023-10-22 ENCOUNTER — Encounter: Payer: Self-pay | Admitting: Physician Assistant

## 2023-10-23 ENCOUNTER — Encounter: Payer: Self-pay | Admitting: Physician Assistant

## 2023-10-25 ENCOUNTER — Encounter: Payer: Self-pay | Admitting: Physician Assistant

## 2023-10-31 DIAGNOSIS — E785 Hyperlipidemia, unspecified: Secondary | ICD-10-CM | POA: Insufficient documentation

## 2023-11-01 ENCOUNTER — Encounter: Payer: Self-pay | Admitting: Physician Assistant

## 2023-11-01 DIAGNOSIS — R748 Abnormal levels of other serum enzymes: Secondary | ICD-10-CM | POA: Insufficient documentation

## 2023-11-07 ENCOUNTER — Other Ambulatory Visit: Payer: Self-pay | Admitting: Physician Assistant

## 2023-11-07 DIAGNOSIS — R29818 Other symptoms and signs involving the nervous system: Secondary | ICD-10-CM

## 2023-11-07 DIAGNOSIS — R159 Full incontinence of feces: Secondary | ICD-10-CM

## 2023-11-07 DIAGNOSIS — M545 Low back pain, unspecified: Secondary | ICD-10-CM

## 2023-11-08 ENCOUNTER — Encounter: Payer: Self-pay | Admitting: Physician Assistant

## 2023-11-26 ENCOUNTER — Encounter: Payer: Self-pay | Admitting: Physician Assistant

## 2023-11-27 ENCOUNTER — Inpatient Hospital Stay
Admission: RE | Admit: 2023-11-27 | Discharge: 2023-11-27 | Discharge status: Home / Self Care | Source: Ambulatory Visit | Attending: Physician Assistant | Admitting: Physician Assistant

## 2023-11-27 DIAGNOSIS — R159 Full incontinence of feces: Secondary | ICD-10-CM

## 2023-11-27 DIAGNOSIS — R29818 Other symptoms and signs involving the nervous system: Secondary | ICD-10-CM

## 2023-11-27 DIAGNOSIS — M545 Low back pain, unspecified: Secondary | ICD-10-CM

## 2023-12-10 ENCOUNTER — Ambulatory Visit: Admitting: Urology

## 2023-12-14 NOTE — Progress Notes (Unsigned)
 Referring Physician:  Jonette Tabitha BRAVO, PA-C [address] [place],  [place] [address]  Primary Physician:  Norvell Boyer, PA-C  History of Present Illness: 12/20/2023 Tabitha Williams has a history of HTN, asthma, OSA, CKD stage 3b, obesity, chronic pain syndrome, bipolar disorder, dyslipidemia, PTSD.   She's seen Dr. Lateef in the past and had injections.   She was told at age 48 that she had disciitis at L4-L5. She was on IV antibiotics. She's had pain since that time.   She has chronic constant LBP with intermittent bilateral leg pain (entire leg) to her knees. She only has leg pain with standing. Pain is worse with standing and walking. Pain improves when she sits down. Some improvement with grocery cart. She has numbness, tingling, and weakness in legs with standing. Pain is better with rest and sitting.   No improvement with previous injections or PT.   She was referred to urology for urinary incontinence that has been present for years along with occasional bowel incontinence. She wears Depends.   Tobacco use: smokes 1 and 1/2 PPD x 30 years.   Conservative measures:  Physical therapy: has not participated in PT in 2-3 years Multimodal medical therapy including regular antiinflammatories:  NSAIDs, Tylenol, muscle relaxers, Gabapentin Injections: 05/18/2023- Intra articular Left Knee Injection  12/21/2021- MBB BIlateral L3-L5 08/15/2021- MBRFA Right L3-L5 08/01/2021- MBRFA Left L3-L5 06/22/2021- MBB Bilateral L3-L5 05/25/2021- MBB Bilateral L3-L5  Past Surgery: no spine surgery  Tabitha Williams has no symptoms of cervical myelopathy.  The symptoms are causing a significant impact on the patient's life.   Review of Systems:  A 10 point review of systems is negative, except for the pertinent positives and negatives detailed in the HPI.  Past Medical History: Past Medical History:  Diagnosis Date   Bipolar 1 disorder with moderate mania ([facility])     Endometriosis    Mental health problem    Rectal bleeding     Past Surgical History: Past Surgical History:  Procedure Laterality Date   BREAST BIOPSY Left 06/22/2022   Stereo Bx, X-clip, benign   BREAST BIOPSY Left 06/22/2022   MM LT BREAST BX W LOC DEV 1ST LESION IMAGE BX SPEC STEREO GUIDE 06/22/2022 [facility]   CESAREAN SECTION     CHOLECYSTECTOMY     COLONOSCOPY WITH PROPOFOL  N/A 06/03/2019   Procedure: COLONOSCOPY WITH PROPOFOL ;  Surgeon: Unk Corinn Skiff, MD;  Location: [facility];  Service: Gastroenterology;  Laterality: N/A;   GALLBLADDER SURGERY     laparoscopic supracervical hysterectomy  2011    Allergies: Allergies as of 12/20/2023 - Review Complete 12/20/2023  Allergen Reaction Noted   Morphine and codeine Nausea And Vomiting 12/24/2015   Sulfa antibiotics Hives 12/24/2015   Adhesive [tape] Rash 12/24/2015   Elemental sulfur Rash 01/16/2017    Medications: Outpatient Encounter Medications as of 12/20/2023  Medication Sig   alprazolam (XANAX) 2 MG tablet    BAC 50-325-40 MG tablet    cloNIDine (CATAPRES) 0.2 MG tablet Take 0.2 mg by mouth at bedtime.   clotrimazole (LOTRIMIN) 1 % cream Apply  as directed to affected area twice a day  for fungal infection   Deutetrabenazine (AUSTEDO PO) Take 60 mg by mouth 2 (two) times daily.   FLOVENT HFA 110 MCG/ACT inhaler Inhale 2 puffs into the lungs 2 (two) times daily.   hydrocortisone  (ANUSOL -HC) 2.5 % rectal cream Place 1 application rectally 2 (two) times daily.   hydrocortisone  2.5 % cream Apply twice daily to  affected areas of rash until clear as needed.   lisinopril-hydrochlorothiazide (ZESTORETIC) 10-12.5 MG tablet Take 1 tablet by mouth daily.   lurasidone (LATUDA) 80 MG TABS tablet Take 80 mg by mouth daily with breakfast.   nortriptyline (PAMELOR) 10 MG capsule Take by mouth.   QUEtiapine Fumarate (SEROQUEL PO) Take 10 mg by mouth daily.   UBRELVY 50 MG TABS Take 2 tablets by mouth 2 (two) times  daily.   [DISCONTINUED] Coenzyme Q10 100 MG TABS Take 1 tablet by mouth once a day  for migraine prevention   [DISCONTINUED] lisinopril-hydrochlorothiazide (ZESTORETIC) 20-25 MG tablet Take 1 tablet by mouth daily.   [DISCONTINUED] Magnesium Oxide 400 MG CAPS Take 1 capsule by mouth once a day  for headaches   [DISCONTINUED] venlafaxine (EFFEXOR) 75 MG tablet Take 75 mg by mouth 2 (two) times daily.   No facility-administered encounter medications on file as of 12/20/2023.    Social History: Social History   Tobacco Use   Smoking status: Every Day    Current packs/day: 1.00    Types: Cigarettes   Smokeless tobacco: Never  Vaping Use   Vaping status: Never Used  Substance Use Topics   Alcohol use: Not Currently   Drug use: Yes    Frequency: 7.0 times per week    Types: Marijuana    Family Medical History: Family History  Problem Relation Age of Onset   Skin cancer Mother    Diabetes Father    Hyperthyroidism Father    Breast cancer Neg Hx     Physical Examination: Vitals:   12/20/23 0856  BP: 124/84    General: Patient is well developed, well nourished, calm, collected, and in no apparent distress. Attention to examination is appropriate.  Respiratory: Patient is breathing without any difficulty.   NEUROLOGICAL:     Awake, alert, oriented to person, place, and time.  Speech is clear and fluent. Fund of knowledge is appropriate.   Cranial Nerves: Pupils equal round and reactive to light.  Facial tone is symmetric.    She has diffuse lower posterior lumbar tenderness.   No abnormal lesions on exposed skin.   Strength: Side Biceps Triceps Deltoid Interossei Grip Wrist Ext. Wrist Flex.  R 5 5 5 5 5 5 5   L 5 5 5 5 5 5 5    Side Iliopsoas Quads Hamstring PF DF EHL  R 5 5 5 5 5 5   L 5 5 5 5 5 5    Reflexes are 2+ and symmetric at the biceps, brachioradialis, patella and achilles.   Hoffman's is absent.  Clonus is not present.   Bilateral upper and lower  extremity sensation is intact to light touch.     No pain with IR/ER of both hips.   Gait is normal.     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 Correlation with radiographs is recommended prior to any operative intervention.   Alignment: Mild grade 1 anterolisthesis of L4 on L5, mildly exaggerated lower lumbar lordosis. Normal vertebral alignment elsewhere.   Vertebrae: Normal background bone marrow signal. Maintained vertebral height. Degenerative appearing marrow signal heterogeneity at both the L4 and L5 vertebral bodies. Up to faint degenerative marrow edema there (series 108, image 9). Hypertrophied and abnormal posterior elements at those levels, further detailed below. Questionable chronic L4 pars fractures (such as series 107, image 11 on the left).   Intact visible sacrum and SI joints.  No other osseous abnormality.   Conus medullaris and cauda equina: Conus extends to the T12-L1 level. No lower spinal cord or conus signal abnormality. Above L4-L5. Normal cauda equina nerve roots   Paraspinal and other soft tissues: Negative.   Disc levels:   Visible lower thoracic levels through L2-L3: Negative.   L3-L4: Negative disc. Mild facet and mild to moderate ligament flavum hypertrophy, greater on the left. Mild spinal stenosis just below the disc space level here. No foraminal stenosis.   L4-L5: Moderate to severe disc space loss. Circumferential disc osteophyte complex with grade 1 anterolisthesis. Moderate to severe posterior element hypertrophy, and some associated lateral mass effect on the thecal sac (series 114, image 28). Overall moderate to severe spinal stenosis. Moderate to severe left greater than right L4 neural foraminal stenosis. Fairly symmetric appearing lateral recess involvement at the descending L5 nerve levels.    L5-S1: Epidural lipomatosis. Mild disc bulging and endplate spurring. Asymmetric and moderate posterior element, facet hypertrophy greater on the left. Spinal stenosis and effaced CSF from the thecal sac in large part related to epidural lipomatosis (series 111, image 34). Asymmetric moderate left lateral recess stenosis, descending left S1 nerve level. No convincing foraminal stenosis.   IMPRESSION: 1. 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 Grade 1 spondylolisthesis there, suspicion of chronic L4 pars fractures, advanced L4-L5 disc degeneration, and advanced posterior element degeneration at both levels. Up to mild associated degenerative vertebral marrow edema. Subsequent moderate to severe multifactorial spinal stenosis at L4-L5. Moderate to severe left > right L4 nerve level foraminal stenosis. And moderate left S1 nerve level lateral recess stenosis.   2. Adjacent segment L3-L4 posterior element hypertrophy, but no lumbar spine degeneration above that level.     Electronically Signed   By: VEAR Hurst M.D.   On: 11/27/2023 08:25    I have personally reviewed the images and agree with the above interpretation.  Assessment and Plan: Tabitha Williams has history of disciitis at L4-L5 at age of 74. Was treated with IV antibiotics. She's had pain since that time.   She has chronic constant LBP with intermittent bilateral leg pain (entire leg) to her knees. She only has leg pain with standing. Pain improves when she sits down. Some improvement with grocery cart. She has numbness, tingling, and weakness in legs with standing.   She has known mild central stenosis L3-L4 and left lateral recess stenosis L5-S1 along with epidural lipomatosis. She has slip L4-L5 with severe DDD, moderate/severe central stenosis and bilateral foraminal stenosis. Probable pars at L4.   Most of her symptoms are likely from L4-L5.   Treatment options discussed with patient and  following plan made:   - She does not want to do any further injections.  - PT recommended for lumbar spine. She declines for now.  - She is interested in medical management of her pain. Referral to [facility] in [place].  - Discussed she would need to do PT prior to any surgery consideration.  - She would need to work on weight loss prior to any surgery. Goal weight of 200-215 lbs. She would also need to quit smoking.  - If she wanted to proceed with PT/possible surgery, would get lumbar xrays with flex/ext as well.  - She will think about her options and I will message her next week to check in.   I spent a total of 35 minutes in face-to-face and non-face-to-face activities related to this patient's care today  including review of outside records, review of imaging, review of symptoms, physical exam, discussion of differential diagnosis, discussion of treatment options, and documentation.   Thank you for involving me in the care of this patient.   Glade Boys PA-C Dept. of Neurosurgery

## 2023-12-20 ENCOUNTER — Ambulatory Visit: Admitting: Orthopedic Surgery

## 2023-12-20 ENCOUNTER — Encounter: Payer: Self-pay | Admitting: Orthopedic Surgery

## 2023-12-20 VITALS — BP 124/84 | Ht 64.0 in | Wt 272.0 lb

## 2023-12-20 DIAGNOSIS — M48062 Spinal stenosis, lumbar region with neurogenic claudication: Secondary | ICD-10-CM

## 2023-12-20 DIAGNOSIS — E882 Lipomatosis, not elsewhere classified: Secondary | ICD-10-CM

## 2023-12-20 DIAGNOSIS — M47816 Spondylosis without myelopathy or radiculopathy, lumbar region: Secondary | ICD-10-CM

## 2023-12-20 DIAGNOSIS — M51362 Other intervertebral disc degeneration, lumbar region with discogenic back pain and lower extremity pain: Secondary | ICD-10-CM | POA: Diagnosis not present

## 2023-12-20 DIAGNOSIS — M48061 Spinal stenosis, lumbar region without neurogenic claudication: Secondary | ICD-10-CM

## 2023-12-20 DIAGNOSIS — M4316 Spondylolisthesis, lumbar region: Secondary | ICD-10-CM

## 2023-12-20 DIAGNOSIS — M5416 Radiculopathy, lumbar region: Secondary | ICD-10-CM

## 2023-12-20 NOTE — Patient Instructions (Signed)
 It was so nice to see you today. Thank you so much for coming in.    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 You have spinal stenosis (pressure on the spinal cord) as well. This is likely causing the pain in your back and legs.   We can get lumbar xrays to look into the slip at L4-L5 further. Let me know if you want to do this.   I recommend physical therapy for your back and if this does not help then you may be a candidate for surgery. You do not need to repeat injections.   Prior to surgery, you would need to lose weight. Goal weight for surgery would be 200-215 lbs.   I want you to see pain management to discuss medical management of your pain. I put in a referral to [facility] in [place]. They should call you to schedule an appointment or you can call them at [phone].   I will send you a message next week to check on you.   Please do not hesitate to call if you have any questions or concerns. You can also message me in MyChart.   Glade Boys PA-C [phone]     The physicians and staff at [facility] are committed to providing excellent care. You may receive a survey asking for feedback about your experience at our office. We value you your feedback and appreciate you taking the time to to fill it out. The [facility] leadership team is also available to discuss your experience in person, feel free to contact us  [phone].

## 2024-01-03 NOTE — Telephone Encounter (Signed)
 She need to be seen by pain management for medication management of pain. She is not interested in further injections.   I don't know of anyone in [place]. Do you know of any pain management practices in [place]?

## 2024-01-04 NOTE — Telephone Encounter (Signed)
 Information has been faxed

## 2024-01-04 NOTE — Telephone Encounter (Signed)
 Can you please send [facility] a referral and send her a message with their contact info?   Thanks.

## 2024-02-25 ENCOUNTER — Ambulatory Visit: Admitting: Urology

## 2024-02-25 VITALS — BP 122/80 | HR 112 | Ht 64.0 in | Wt 276.0 lb

## 2024-02-25 DIAGNOSIS — N3946 Mixed incontinence: Secondary | ICD-10-CM | POA: Diagnosis not present

## 2024-02-25 DIAGNOSIS — R32 Unspecified urinary incontinence: Secondary | ICD-10-CM

## 2024-02-25 LAB — URINALYSIS, COMPLETE
Bilirubin, UA: NEGATIVE
Glucose, UA: NEGATIVE
Ketones, UA: NEGATIVE
Leukocytes,UA: NEGATIVE
Nitrite, UA: NEGATIVE
Protein,UA: NEGATIVE
RBC, UA: NEGATIVE
Specific Gravity, UA: <1.005 — ABNORMAL LOW (ref 1.005–1.030)
Urobilinogen, Ur: 0.2 mg/dL (ref 0.2–1.0)
pH, UA: 6 (ref 5.0–7.5)

## 2024-02-25 LAB — MICROSCOPIC EXAMINATION: Epithelial Cells (non renal): >10 /HPF — AB (ref 0–10)

## 2024-02-25 MED ORDER — GEMTESA 75 MG PO TABS
75.0000 mg | ORAL_TABLET | Freq: Every day | ORAL | 11 refills | Status: AC
Start: 2024-02-25 — End: 2025-02-19

## 2024-02-25 NOTE — Patient Instructions (Signed)

## 2024-02-25 NOTE — Progress Notes (Signed)
 02/25/2024 8:51 AM   Tabitha Williams 1976-03-15 [id number]  Referring provider: Jonette Tabitha BRAVO, PA-C [address] [place],  [place] [address]  Chief Complaint  Patient presents with   Establish Care   Urinary Incontinence    HPI: I was consulted to assist the patient as urinary incontinence.  Her primary symptom is urge incontinence.  She also can leak sitting in a chair.  She leaks with coughing sneezing and sometimes bending lifting.  Sometimes she has bedwetting.  She wears 4 or 5 pads a day with of varying severity of leakage.  She is not a strong historian.  She says she is disabled due to bipolar illness and some other issues noted in her chart  She can void twice an hour.  She cannot hold it for 2 hours.  No nocturia.  Flow varies  She has had a hysterectomy.  She is interested in taking Gemtesa  No neurologic issues.  No previous treatment.  No history of kidney stones bladder surgery or bladder infection   PMH: Past Medical History:  Diagnosis Date   Bipolar 1 disorder with moderate mania (HCC)    Endometriosis    Mental health problem    Rectal bleeding     Surgical History: Past Surgical History:  Procedure Laterality Date   BREAST BIOPSY Left 06/22/2022   Stereo Bx, X-clip, benign   BREAST BIOPSY Left 06/22/2022   MM LT BREAST BX W LOC DEV 1ST LESION IMAGE BX SPEC STEREO GUIDE 06/22/2022 [facility]   CESAREAN SECTION     CHOLECYSTECTOMY     COLONOSCOPY WITH PROPOFOL  N/A 06/03/2019   Procedure: COLONOSCOPY WITH PROPOFOL ;  Surgeon: Unk Corinn Skiff, MD;  Location: [facility];  Service: Gastroenterology;  Laterality: N/A;   GALLBLADDER SURGERY     laparoscopic supracervical hysterectomy  2011    Home Medications:  Allergies as of 02/25/2024       Reactions   Morphine And Codeine Nausea And Vomiting   Sulfa Antibiotics Hives   Adhesive [tape] Rash   Elemental Sulfur Rash        Medication List        Accurate as of  February 25, 2024  8:51 AM. If you have any questions, ask your nurse or doctor.          alprazolam 2 MG tablet Commonly known as: XANAX   Arnuity Ellipta 100 MCG/ACT Aepb Generic drug: Fluticasone Furoate Inhale 1 puff into the lungs daily.   atorvastatin 10 MG tablet Commonly known as: LIPITOR Take 10 mg by mouth daily.   AUSTEDO PO Take 60 mg by mouth 2 (two) times daily.   Bac 50-325-40 MG tablet Generic drug: butalbital-acetaminophen-caffeine   cloNIDine 0.2 MG tablet Commonly known as: CATAPRES Take 0.2 mg by mouth at bedtime.   clotrimazole 1 % cream Commonly known as: LOTRIMIN Apply  as directed to affected area twice a day  for fungal infection   Flovent HFA 110 MCG/ACT inhaler Generic drug: fluticasone Inhale 2 puffs into the lungs 2 (two) times daily.   hydrocortisone  2.5 % cream Apply twice daily to affected areas of rash until clear as needed.   hydrocortisone  2.5 % rectal cream Commonly known as: ANUSOL -HC Place 1 application rectally 2 (two) times daily.   lisinopril 10 MG tablet Commonly known as: ZESTRIL LISINOPRIL 10 MG TABS   lisinopril-hydrochlorothiazide 10-12.5 MG tablet Commonly known as: ZESTORETIC Take 1 tablet by mouth daily.   lurasidone 80 MG Tabs tablet Commonly known  as: LATUDA Take 80 mg by mouth daily with breakfast.   Nicoderm CQ 21 MG/24HR patch Generic drug: nicotine NICODERM CQ 21 MG/24HR PT24   nortriptyline 10 MG capsule Commonly known as: PAMELOR Take by mouth.   SEROQUEL PO Take 10 mg by mouth daily.   Ubrelvy 50 MG Tabs Generic drug: Ubrogepant Take 2 tablets by mouth 2 (two) times daily.   varenicline 0.5 MG tablet Commonly known as: CHANTIX Take by mouth.        Allergies:  Allergies  Allergen Reactions   Morphine And Codeine Nausea And Vomiting   Sulfa Antibiotics Hives   Adhesive [Tape] Rash   Elemental Sulfur Rash    Family History: Family History  Problem Relation Age of Onset    Skin cancer Mother    Diabetes Father    Hyperthyroidism Father    Breast cancer Neg Hx     Social History:  reports that she has been smoking cigarettes. She has never used smokeless tobacco. She reports that she does not currently use alcohol. She reports current drug use. Frequency: 7.00 times per week. Drug: Marijuana.  ROS:                                        Physical Exam: There were no vitals taken for this visit.  Constitutional:  Alert and oriented, No acute distress. HEENT: [place] AT, moist mucus membranes.  Trachea midline, no masses.   Laboratory Data: Lab Results  Component Value Date   WBC 9.8 05/20/2019   WBC 9.8 05/20/2019   HGB 14.1 05/20/2019   HGB 14.1 05/20/2019   HCT 40.0 05/20/2019   HCT 40.0 05/20/2019   MCV 94 05/20/2019   MCV 94 05/20/2019   PLT 417 05/20/2019   PLT 417 05/20/2019    Lab Results  Component Value Date   CREATININE 1.48 (H) 05/20/2019   CREATININE 1.48 (H) 05/20/2019    No results found for: PSA  No results found for: TESTOSTERONE  No results found for: HGBA1C  Urinalysis No results found for: COLORURINE, APPEARANCEUR, LABSPEC, PHURINE, GLUCOSEU, HGBUR, BILIRUBINUR, KETONESUR, PROTEINUR, UROBILINOGEN, NITRITE, LEUKOCYTESUR  Pertinent Imaging: Urine reviewed and sent for culture.  Chart reviewed  Assessment & Plan: Patient has mixed incontinence and bedwetting and can leak sitting in a chair.  I think she will benefit from urodynamics.  Patient at her request I gave her Gemtesa samples and prescription and I will have her come back for pelvic examination and cystoscopy.  I described urodynamics and she knows that she may need a test in the future.  Call if urine culture positive  1. Urinary incontinence, unspecified type (Primary)  - Urinalysis, Complete   No follow-ups on file.  Glendia DELENA Elizabeth, MD  [facility] [address], [address] [place], [place] [address] [phone]

## 2024-02-26 MED ORDER — GEMTESA 75 MG PO TABS
75.0000 mg | ORAL_TABLET | Freq: Every day | ORAL | 0 refills | Status: AC
Start: 2024-02-26 — End: ?

## 2024-02-26 NOTE — Addendum Note (Signed)
 Addended byBETHA CORIE PLATER on: 02/26/2024 11:01 AM   Modules accepted: Orders

## 2024-02-28 LAB — CULTURE, URINE COMPREHENSIVE

## 2024-05-05 ENCOUNTER — Encounter: Payer: Self-pay | Admitting: Physician Assistant

## 2024-05-06 ENCOUNTER — Other Ambulatory Visit: Payer: Self-pay | Admitting: Physician Assistant

## 2024-05-06 DIAGNOSIS — Z1231 Encounter for screening mammogram for malignant neoplasm of breast: Secondary | ICD-10-CM

## 2024-05-12 ENCOUNTER — Other Ambulatory Visit: Admitting: Urology

## 2024-06-05 ENCOUNTER — Ambulatory Visit
Admission: RE | Admit: 2024-06-05 | Discharge: 2024-06-05 | Disposition: A | Source: Ambulatory Visit | Attending: Physician Assistant | Admitting: Physician Assistant

## 2024-06-05 DIAGNOSIS — Z1231 Encounter for screening mammogram for malignant neoplasm of breast: Secondary | ICD-10-CM

## 2024-07-07 ENCOUNTER — Other Ambulatory Visit: Admitting: Urology
